# Patient Record
Sex: Male | Born: 1980
Health system: Southern US, Community
[De-identification: ages and names within clinical notes are randomized; demographics above are authoritative.]

## PROBLEM LIST (undated history)

## (undated) DIAGNOSIS — I10 Essential (primary) hypertension: Secondary | ICD-10-CM

## (undated) DIAGNOSIS — R7302 Impaired glucose tolerance (oral): Principal | ICD-10-CM

## (undated) DIAGNOSIS — M109 Gout, unspecified: Secondary | ICD-10-CM

## (undated) DIAGNOSIS — E781 Pure hyperglyceridemia: Secondary | ICD-10-CM

## (undated) HISTORY — DX: Pure hyperglyceridemia: E78.1

## (undated) HISTORY — DX: Impaired glucose tolerance (oral): R73.02

## (undated) HISTORY — DX: Gout, unspecified: M10.9

## (undated) HISTORY — DX: Essential (primary) hypertension: I10

---

## 2003-05-11 ENCOUNTER — Emergency Department (HOSPITAL_COMMUNITY): Admission: AD | Admit: 2003-05-11 | Discharge: 2003-05-12 | Payer: Self-pay | Admitting: Family Medicine

## 2004-07-25 ENCOUNTER — Emergency Department (HOSPITAL_COMMUNITY): Admission: EM | Admit: 2004-07-25 | Discharge: 2004-07-25 | Payer: Self-pay | Admitting: Family Medicine

## 2007-09-06 ENCOUNTER — Emergency Department (HOSPITAL_COMMUNITY): Admission: EM | Admit: 2007-09-06 | Discharge: 2007-09-06 | Payer: Self-pay | Admitting: Family Medicine

## 2009-12-13 ENCOUNTER — Emergency Department (HOSPITAL_BASED_OUTPATIENT_CLINIC_OR_DEPARTMENT_OTHER): Admission: EM | Admit: 2009-12-13 | Discharge: 2009-12-13 | Payer: Self-pay | Admitting: Emergency Medicine

## 2009-12-15 ENCOUNTER — Emergency Department (HOSPITAL_BASED_OUTPATIENT_CLINIC_OR_DEPARTMENT_OTHER): Admission: EM | Admit: 2009-12-15 | Discharge: 2009-12-15 | Payer: Self-pay | Admitting: Emergency Medicine

## 2010-07-17 LAB — CBC
HCT: 45.1 % (ref 39.0–52.0)
HCT: 40.9 % (ref 39.0–52.0)
MCH: 26.2 pg (ref 26.0–34.0)
MCHC: 34 g/dL (ref 30.0–36.0)
MCHC: 33.3 g/dL (ref 30.0–36.0)
MCV: 77.2 fL — ABNORMAL LOW (ref 78.0–100.0)
MCV: 77.4 fL — ABNORMAL LOW (ref 78.0–100.0)
Platelets: 203 10*3/uL (ref 150–400)
Platelets: 228 10*3/uL (ref 150–400)
RDW: 13.2 % (ref 11.5–15.5)
RDW: 13 % (ref 11.5–15.5)
WBC: 7.4 10*3/uL (ref 4.0–10.5)

## 2010-07-17 LAB — URINALYSIS, ROUTINE W REFLEX MICROSCOPIC
Bilirubin Urine: NEGATIVE
Bilirubin Urine: NEGATIVE
Glucose, UA: NEGATIVE mg/dL
Ketones, ur: NEGATIVE mg/dL
Ketones, ur: 15 mg/dL — AB
Leukocytes, UA: NEGATIVE
Nitrite: NEGATIVE
Nitrite: NEGATIVE
Protein, ur: NEGATIVE mg/dL
Protein, ur: 30 mg/dL — AB
Urobilinogen, UA: 0.2 mg/dL (ref 0.0–1.0)
pH: 6.5 (ref 5.0–8.0)

## 2010-07-17 LAB — DIFFERENTIAL
Basophils Absolute: 0 10*3/uL (ref 0.0–0.1)
Basophils Absolute: 0.1 10*3/uL (ref 0.0–0.1)
Basophils Relative: 2 % — ABNORMAL HIGH (ref 0–1)
Eosinophils Absolute: 0 10*3/uL (ref 0.0–0.7)
Eosinophils Absolute: 0 10*3/uL (ref 0.0–0.7)
Eosinophils Relative: 0 % (ref 0–5)
Eosinophils Relative: 0 % (ref 0–5)
Lymphocytes Relative: 16 % (ref 12–46)
Monocytes Absolute: 0.7 10*3/uL (ref 0.1–1.0)
Monocytes Absolute: 0.7 10*3/uL (ref 0.1–1.0)

## 2010-07-17 LAB — BASIC METABOLIC PANEL
BUN: 9 mg/dL (ref 6–23)
BUN: 10 mg/dL (ref 6–23)
CO2: 29 mEq/L (ref 19–32)
Chloride: 100 mEq/L (ref 96–112)
Chloride: 104 mEq/L (ref 96–112)
Creatinine, Ser: 1.1 mg/dL (ref 0.4–1.5)
Creatinine, Ser: 1.1 mg/dL (ref 0.4–1.5)
Glucose, Bld: 139 mg/dL — ABNORMAL HIGH (ref 70–99)
Glucose, Bld: 100 mg/dL — ABNORMAL HIGH (ref 70–99)
Potassium: 3.5 mEq/L (ref 3.5–5.1)

## 2010-07-17 LAB — URINE MICROSCOPIC-ADD ON

## 2010-09-21 ENCOUNTER — Encounter: Payer: Self-pay | Admitting: Internal Medicine

## 2010-09-22 ENCOUNTER — Encounter: Payer: Self-pay | Admitting: Internal Medicine

## 2010-09-22 ENCOUNTER — Other Ambulatory Visit (INDEPENDENT_AMBULATORY_CARE_PROVIDER_SITE_OTHER): Payer: 59

## 2010-09-22 ENCOUNTER — Ambulatory Visit (INDEPENDENT_AMBULATORY_CARE_PROVIDER_SITE_OTHER): Payer: 59 | Admitting: Internal Medicine

## 2010-09-22 VITALS — BP 142/104 | HR 95 | Temp 98.1°F | Ht 64.0 in | Wt 223.2 lb

## 2010-09-22 DIAGNOSIS — Z0001 Encounter for general adult medical examination with abnormal findings: Secondary | ICD-10-CM | POA: Insufficient documentation

## 2010-09-22 DIAGNOSIS — Z0000 Encounter for general adult medical examination without abnormal findings: Secondary | ICD-10-CM

## 2010-09-22 DIAGNOSIS — Z23 Encounter for immunization: Secondary | ICD-10-CM

## 2010-09-22 DIAGNOSIS — I10 Essential (primary) hypertension: Secondary | ICD-10-CM

## 2010-09-22 HISTORY — DX: Essential (primary) hypertension: I10

## 2010-09-22 LAB — URINALYSIS, ROUTINE W REFLEX MICROSCOPIC
Ketones, ur: NEGATIVE
Specific Gravity, Urine: 1.02 (ref 1.000–1.030)
Total Protein, Urine: NEGATIVE
Urine Glucose: NEGATIVE

## 2010-09-22 LAB — CBC WITH DIFFERENTIAL/PLATELET
Basophils Relative: 0.4 % (ref 0.0–3.0)
HCT: 45 % (ref 39.0–52.0)
Hemoglobin: 15.2 g/dL (ref 13.0–17.0)
Lymphocytes Relative: 19.7 % (ref 12.0–46.0)
Lymphs Abs: 1.6 10*3/uL (ref 0.7–4.0)
MCHC: 33.8 g/dL (ref 30.0–36.0)
Monocytes Relative: 8.2 % (ref 3.0–12.0)
Neutro Abs: 5.6 10*3/uL (ref 1.4–7.7)
RBC: 5.83 Mil/uL — ABNORMAL HIGH (ref 4.22–5.81)

## 2010-09-22 LAB — HEPATIC FUNCTION PANEL
Albumin: 3.8 g/dL (ref 3.5–5.2)
Alkaline Phosphatase: 91 U/L (ref 39–117)
Bilirubin, Direct: 0 mg/dL (ref 0.0–0.3)
Total Protein: 7.7 g/dL (ref 6.0–8.3)

## 2010-09-22 LAB — BASIC METABOLIC PANEL
CO2: 26 mEq/L (ref 19–32)
Calcium: 9.3 mg/dL (ref 8.4–10.5)
Creatinine, Ser: 0.9 mg/dL (ref 0.4–1.5)
GFR: 123.85 mL/min (ref >60.00)
Sodium: 141 mEq/L (ref 135–145)

## 2010-09-22 LAB — LIPID PANEL
HDL: 47.2 mg/dL (ref >39.00)
LDL Cholesterol: 73 mg/dL (ref 0–99)
Total CHOL/HDL Ratio: 3
Triglycerides: 197 mg/dL — ABNORMAL HIGH (ref 0.0–149.0)
VLDL: 39.4 mg/dL (ref 0.0–40.0)

## 2010-09-22 MED ORDER — TETANUS-DIPHTH-ACELL PERTUSSIS 5-2.5-18.5 LF-MCG/0.5 IM SUSP
0.5000 mL | Freq: Once | INTRAMUSCULAR | Status: AC
  Administered 2010-09-22: 0.5 mL via INTRAMUSCULAR

## 2010-09-22 MED ORDER — AMLODIPINE BESYLATE 5 MG PO TABS: 5.0000 mg | ORAL_TABLET | Freq: Every day | ORAL | Status: DC

## 2010-09-22 NOTE — Progress Notes (Signed)
  Subjective:    Patient ID: Peter Rivera, male    DOB: Sep 07, 1980, 30 y.o.   MRN: [id number]  HPI Here for wellness and f/u;  Overall doing ok;  Pt denies CP, worsening SOB, DOE, wheezing, orthopnea, PND, worsening LE edema, palpitations, dizziness or syncope.  Pt denies neurological change such as new Headache, facial or extremity weakness.  Pt denies polydipsia, polyuria, or low sugar symptoms. Pt states overall good compliance with treatment and medications, good tolerability, and trying to follow lower cholesterol diet.  Pt denies worsening depressive symptoms, suicidal ideation or panic. No fever, wt loss, night sweats, loss of appetite, or other constitutional symptoms.  Pt states good ability with ADL's, low fall risk, home safety reviewed and adequate, no significant changes in hearing or vision, and occasionally active with exercise.  No other new complaints or concerns today Past Medical History  Diagnosis Date  . Hypertension   . HTN (hypertension) 09/22/2010   History reviewed. No pertinent past surgical history.  reports that he has never smoked. He does not have any smokeless tobacco history on file. He reports that he drinks alcohol. He reports that he does not use illicit drugs. family history is not on file. No Known Allergies No current outpatient prescriptions on file prior to visit.   No current facility-administered medications on file prior to visit.    Review of Systems Review of Systems  Constitutional: Negative for diaphoresis, activity change, appetite change and unexpected weight change.  HENT: Negative for hearing loss, ear pain, facial swelling, mouth sores and neck stiffness.   Eyes: Negative for pain, redness and visual disturbance.  Respiratory: Negative for shortness of breath and wheezing.   Cardiovascular: Negative for chest pain and palpitations.  Gastrointestinal: Negative for diarrhea, blood in stool, abdominal distention and rectal pain.    Genitourinary: Negative for hematuria, flank pain and decreased urine volume.  Musculoskeletal: Negative for myalgias and joint swelling.  Skin: Negative for color change and wound.  Neurological: Negative for syncope and numbness.  Hematological: Negative for adenopathy.  Psychiatric/Behavioral: Negative for hallucinations, self-injury, decreased concentration and agitation.      Objective:   Physical Exam BP 142/104  Pulse 95  Temp(Src) 98.1 F (36.7 C) (Oral)  Ht 5\' 4"  (1.626 m)  Wt 223 lb 4 oz (101.266 kg)  BMI 38.32 kg/m2  SpO2 97% Physical Exam  VS noted Constitutional: Pt is oriented to person, place, and time. Appears well-developed and well-nourished.  HENT:  Head: Normocephalic and atraumatic.  Right Ear: External ear normal.  Left Ear: External ear normal.  Nose: Nose normal.  Mouth/Throat: Oropharynx is clear and moist.  Eyes: Conjunctivae and EOM are normal. Pupils are equal, round, and reactive to light.  Neck: Normal range of motion. Neck supple. No JVD present. No tracheal deviation present.  Cardiovascular: Normal rate, regular rhythm, normal heart sounds and intact distal pulses.   Pulmonary/Chest: Effort normal and breath sounds normal.  Abdominal: Soft. Bowel sounds are normal. There is no tenderness.  Musculoskeletal: Normal range of motion. Exhibits no edema.  Lymphadenopathy:  Has no cervical adenopathy.  Neurological: Pt is alert and oriented to person, place, and time. Pt has normal reflexes. No cranial nerve deficit.  Skin: Skin is warm and dry. No rash noted.  Psychiatric:  Has  normal mood and affect. Behavior is normal.         Assessment & Plan:

## 2010-09-22 NOTE — Patient Instructions (Signed)
You had the tetanus shot today Start the amlodipine 5 mg per day for blood pressure Please check your BP regularly, at the local drug stores or at home Please go to LAB in the Basement for the blood and/or urine tests to be done today Please call the phone number [phone] (the PhoneTree System) for results of testing in 2-3 days;  When calling, simply dial the number, and when prompted enter the MRN number above (the Medical Record Number) and the # key, then the message should start. Please return in 6 months

## 2010-09-22 NOTE — Assessment & Plan Note (Signed)
Overall doing well, age appropriate education and counseling updated, referrals for preventative services and immunizations addressed, dietary and smoking counseling addressed, most recent labs and ECG reviewed.  I have personally reviewed and have noted: 1) the patient's medical and social history 2) The pt's use of alcohol, tobacco, and illicit drugs 3) The patient's current medications and supplements 4) Functional ability including ADL's, fall risk, home safety risk, hearing and visual impairment 5) Diet and physical activities 6) Evidence for depression or mood disorder 7) The patient's height, weight, and BMI have been recorded in the chart I have made referrals, and provided counseling and education based on review of the above Also for tetanus today as he is due

## 2010-09-22 NOTE — Assessment & Plan Note (Signed)
Uncontrolled, more so since last sept when passed the [employer] physical,  To start amlodipine 5 mg, f/u  BP at home and next visit

## 2010-09-23 NOTE — Progress Notes (Signed)
Quick Note:  Voice message left on PhoneTree system - lab is negative, normal or otherwise stable, pt to continue same tx ______ 

## 2010-12-29 ENCOUNTER — Ambulatory Visit (INDEPENDENT_AMBULATORY_CARE_PROVIDER_SITE_OTHER): Payer: 59 | Admitting: Internal Medicine

## 2010-12-29 ENCOUNTER — Encounter: Payer: Self-pay | Admitting: Internal Medicine

## 2010-12-29 VITALS — BP 140/88 | HR 114 | Temp 98.7°F | Ht 64.0 in | Wt 223.4 lb

## 2010-12-29 DIAGNOSIS — I10 Essential (primary) hypertension: Secondary | ICD-10-CM

## 2010-12-29 DIAGNOSIS — R5383 Other fatigue: Secondary | ICD-10-CM | POA: Insufficient documentation

## 2010-12-29 DIAGNOSIS — E781 Pure hyperglyceridemia: Secondary | ICD-10-CM

## 2010-12-29 HISTORY — DX: Pure hyperglyceridemia: E78.1

## 2010-12-29 MED ORDER — LOSARTAN POTASSIUM 100 MG PO TABS: 100.0000 mg | ORAL_TABLET | Freq: Every day | ORAL | Status: DC

## 2010-12-29 NOTE — Patient Instructions (Addendum)
Take all new medications as prescribed - the generic cozaar 100 mg per day Continue all other medications as before - the generic for norvasc Please check your BP on a regular basis - your goal is to be less than 140/90 (though the top number at 110 would be considered very good, and 120's and 130's is OK) Please return in 3 months, or sooner if needed

## 2010-12-29 NOTE — Assessment & Plan Note (Addendum)
Improved, but still elevated, with goal < 140/90; to add cozaar 100 qd, f/u BP at home and next visit, work on wt loss, exercise, low salt diet  BP Readings from Last 3 Encounters:  12/29/10 140/88  09/22/10 142/104

## 2010-12-29 NOTE — Assessment & Plan Note (Signed)
Mild, d/w pt, for lower fat diet and wt loss as well

## 2010-12-29 NOTE — Assessment & Plan Note (Signed)
Etiology unclear, Exam otherwise benign,  follow with expectant management, last labs reveiwed with pt  Lab Results  Component Value Date   WBC 7.9 09/22/2010   HGB 15.2 09/22/2010   HCT 45.0 09/22/2010   PLT 311.0 09/22/2010   CHOL 160 09/22/2010   TRIG 197.0* 09/22/2010   HDL 47.20 09/22/2010   ALT 21 09/22/2010   AST 23 09/22/2010   NA 141 09/22/2010   K 4.4 09/22/2010   CL 106 09/22/2010   CREATININE 0.9 09/22/2010   BUN 14 09/22/2010   CO2 26 09/22/2010   TSH 0.84 09/22/2010

## 2010-12-29 NOTE — Progress Notes (Signed)
  Subjective:    Patient ID: Peter Rivera, male    DOB: 06/19/1980, 30 y.o.   MRN: [id number]  HPI  Here to f/u; overall doing ok,  Pt denies chest pain, increased sob or doe, wheezing, orthopnea, PND, increased LE swelling, palpitations, dizziness or syncope.  Pt denies new neurological symptoms such as new headache, or facial or extremity weakness or numbness   Pt denies polydipsia, polyuria,  Pt states overall good compliance with meds, trying to follow lower cholesterol  diet, wt overall stable but little exercise however. Has not had chance to check BP at home or elsewhere since last visit.  Good med compliance and tolerability.  No other new complaints.  Does have sense of ongoing fatigue, but denies signficant hypersomnolence., and seems to perform well at work, which is quite physically demanding Past Medical History  Diagnosis Date  . Hypertension   . HTN (hypertension) 09/22/2010   No past surgical history on file.  reports that he has never smoked. He does not have any smokeless tobacco history on file. He reports that he drinks alcohol. He reports that he does not use illicit drugs. family history is not on file. No Known Allergies Current Outpatient Prescriptions on File Prior to Visit  Medication Sig Dispense Refill  . amLODipine (NORVASC) 5 MG tablet Take 1 tablet (5 mg total) by mouth daily.  90 tablet  3   Review of Systems Review of Systems  Constitutional: Negative for diaphoresis and unexpected weight change.  HENT: Negative for drooling and tinnitus.   Eyes: Negative for photophobia and visual disturbance.  Respiratory: Negative for choking and stridor.   Gastrointestinal: Negative for vomiting and blood in stool.  Genitourinary: Negative for hematuria and decreased urine volume.      Objective:   Physical Exam BP 140/88  Pulse 114  Temp(Src) 98.7 F (37.1 C) (Oral)  Ht 5\' 4"  (1.626 m)  Wt 223 lb 6 oz (101.322 kg)  BMI 38.34 kg/m2  SpO2 97% Physical Exam  VS  noted Constitutional: Pt appears well-developed and well-nourished.  HENT: Head: Normocephalic.  Right Ear: External ear normal.  Left Ear: External ear normal.  Eyes: Conjunctivae and EOM are normal. Pupils are equal, round, and reactive to light.  Neck: Normal range of motion. Neck supple.  Cardiovascular: Normal rate and regular rhythm.   Pulmonary/Chest: Effort normal and breath sounds normal.  Neurological: Pt is alert. No cranial nerve deficit.  Skin: Skin is warm. No erythema.  Psychiatric: Pt behavior is normal. Thought content normal.         Assessment & Plan:

## 2011-03-29 ENCOUNTER — Other Ambulatory Visit (INDEPENDENT_AMBULATORY_CARE_PROVIDER_SITE_OTHER): Payer: BC Managed Care – PPO

## 2011-03-29 ENCOUNTER — Encounter: Payer: Self-pay | Admitting: Internal Medicine

## 2011-03-29 ENCOUNTER — Ambulatory Visit (INDEPENDENT_AMBULATORY_CARE_PROVIDER_SITE_OTHER): Payer: BC Managed Care – PPO | Admitting: Internal Medicine

## 2011-03-29 VITALS — BP 132/90 | HR 98 | Temp 98.0°F | Ht 64.0 in | Wt 227.5 lb

## 2011-03-29 DIAGNOSIS — R7302 Impaired glucose tolerance (oral): Secondary | ICD-10-CM

## 2011-03-29 DIAGNOSIS — Z0000 Encounter for general adult medical examination without abnormal findings: Secondary | ICD-10-CM

## 2011-03-29 DIAGNOSIS — R7309 Other abnormal glucose: Secondary | ICD-10-CM

## 2011-03-29 DIAGNOSIS — E781 Pure hyperglyceridemia: Secondary | ICD-10-CM

## 2011-03-29 DIAGNOSIS — I10 Essential (primary) hypertension: Secondary | ICD-10-CM

## 2011-03-29 HISTORY — DX: Impaired glucose tolerance (oral): R73.02

## 2011-03-29 LAB — LIPID PANEL
Cholesterol: 152 mg/dL (ref 0–200)
LDL Cholesterol: 87 mg/dL (ref 0–99)
Total CHOL/HDL Ratio: 4
Triglycerides: 114 mg/dL (ref 0.0–149.0)

## 2011-03-29 LAB — BASIC METABOLIC PANEL
Calcium: 9.1 mg/dL (ref 8.4–10.5)
GFR: 95.41 mL/min (ref >60.00)
Potassium: 3.9 mEq/L (ref 3.5–5.1)
Sodium: 140 mEq/L (ref 135–145)

## 2011-03-29 LAB — HEMOGLOBIN A1C: Hgb A1c MFr Bld: 5.7 % (ref 4.6–6.5)

## 2011-03-29 NOTE — Assessment & Plan Note (Signed)
stable overall by hx and exam, most recent data reviewed with pt, and pt to continue medical treatment as before  BP Readings from Last 3 Encounters:  03/29/11 132/90  12/29/10 140/88  09/22/10 142/104

## 2011-03-29 NOTE — Progress Notes (Signed)
  Subjective:    Patient ID: Peter Rivera, male    DOB: 1981/04/28, 30 y.o.   MRN: [id number]  HPI Here to f/u; overall doing ok,  Pt denies chest pain, increased sob or doe, wheezing, orthopnea, PND, increased LE swelling, palpitations, dizziness or syncope.  Pt denies new neurological symptoms such as new headache, or facial or extremity weakness or numbness   Pt denies polydipsia, polyuria, or low sugar symptoms such as weakness or confusion improved with po intake.  Pt states overall good compliance with meds, trying to follow lower cholesterol, diabetic diet, wt overall stable but little exercise however. No other new complaints at this time Past Medical History  Diagnosis Date  . Hypertension   . HTN (hypertension) 09/22/2010  . Hypertriglyceridemia 12/29/2010  . Impaired glucose tolerance 03/29/2011   No past surgical history on file.  reports that he has never smoked. He does not have any smokeless tobacco history on file. He reports that he drinks alcohol. He reports that he does not use illicit drugs. family history is not on file. No Known Allergies Current Outpatient Prescriptions on File Prior to Visit  Medication Sig Dispense Refill  . amLODipine (NORVASC) 5 MG tablet Take 1 tablet (5 mg total) by mouth daily.  90 tablet  3  . losartan (COZAAR) 100 MG tablet Take 1 tablet (100 mg total) by mouth daily.  90 tablet  3   Review of Systems Review of Systems  Constitutional: Negative for diaphoresis and unexpected weight change.  HENT: Negative for drooling and tinnitus.   Eyes: Negative for photophobia and visual disturbance.  Respiratory: Negative for choking and stridor.   Gastrointestinal: Negative for vomiting and blood in stool.  Genitourinary: Negative for hematuria and decreased urine volume.    Objective:   Physical Exam BP 132/90  Pulse 98  Temp(Src) 98 F (36.7 C) (Oral)  Ht 5\' 4"  (1.626 m)  Wt 227 lb 8 oz (103.193 kg)  BMI 39.05 kg/m2  SpO2 96% Physical  Exam  VS noted Constitutional: Pt appears well-developed and well-nourished.  HENT: Head: Normocephalic.  Right Ear: External ear normal.  Left Ear: External ear normal.  Eyes: Conjunctivae and EOM are normal. Pupils are equal, round, and reactive to light.  Neck: Normal range of motion. Neck supple.  Cardiovascular: Normal rate and regular rhythm.   Pulmonary/Chest: Effort normal and breath sounds normal.  Abd:  Soft, NT, non-distended, + BS Neurological: Pt is alert. No cranial nerve deficit.  Skin: Skin is warm. No erythema.  Psychiatric: Pt behavior is normal. Thought content normal.     Assessment & Plan:

## 2011-03-29 NOTE — Assessment & Plan Note (Signed)
stable overall by hx and exam, most recent data reviewed with pt, and pt to continue medical treatment as before, for wt loss/diet and check a1c

## 2011-03-29 NOTE — Patient Instructions (Signed)
Continue all other medications as before Please remember to focus on low fat/low cholesterol diet, regular exercise, and weight control Please go to LAB in the Basement for the blood and/or urine tests to be done today Please call the phone number [phone] (the PhoneTree System) for results of testing in 2-3 days;  When calling, simply dial the number, and when prompted enter the MRN number above (the Medical Record Number) and the # key, then the message should start. Please return in 6 mo with Lab testing done 3-5 days before

## 2011-03-29 NOTE — Assessment & Plan Note (Signed)
stable overall by hx and exam, most recent data reviewed with pt, and pt to continue medical treatment as before Lab Results  Component Value Date   CHOL 160 09/22/2010   HDL 47.20 09/22/2010   LDLCALC 73 09/22/2010   TRIG 197.0* 09/22/2010   CHOLHDL 3 09/22/2010

## 2011-08-08 ENCOUNTER — Encounter (HOSPITAL_COMMUNITY): Payer: Self-pay | Admitting: *Deleted

## 2011-08-08 ENCOUNTER — Emergency Department (HOSPITAL_COMMUNITY)
Admission: EM | Admit: 2011-08-08 | Discharge: 2011-08-08 | Disposition: A | Discharge status: Home / Self Care | Payer: BC Managed Care – PPO | Source: Home / Self Care | Attending: Family Medicine | Admitting: Family Medicine

## 2011-08-08 ENCOUNTER — Emergency Department (INDEPENDENT_AMBULATORY_CARE_PROVIDER_SITE_OTHER): Payer: BC Managed Care – PPO

## 2011-08-08 DIAGNOSIS — M109 Gout, unspecified: Secondary | ICD-10-CM

## 2011-08-08 LAB — URIC ACID: Uric Acid, Serum: 8.7 mg/dL — ABNORMAL HIGH (ref 4.0–7.8)

## 2011-08-08 MED ORDER — METHYLPREDNISOLONE SODIUM SUCC 125 MG IJ SOLR
125.0000 mg | Freq: Once | INTRAMUSCULAR | Status: AC
  Administered 2011-08-08: 125 mg via INTRAMUSCULAR

## 2011-08-08 MED ORDER — INDOMETHACIN 50 MG PO CAPS: 50.0000 mg | ORAL_CAPSULE | Freq: Three times a day (TID) | ORAL | Status: DC

## 2011-08-08 MED ORDER — COLCHICINE 0.6 MG PO TABS: 0.6000 mg | ORAL_TABLET | Freq: Every day | ORAL | Status: DC

## 2011-08-08 MED ORDER — KETOROLAC TROMETHAMINE 60 MG/2ML IM SOLN
INTRAMUSCULAR | Status: AC
  Filled 2011-08-08: qty 2

## 2011-08-08 MED ORDER — KETOROLAC TROMETHAMINE 60 MG/2ML IM SOLN
60.0000 mg | Freq: Once | INTRAMUSCULAR | Status: AC
  Administered 2011-08-08: 60 mg via INTRAMUSCULAR

## 2011-08-08 MED ORDER — METHYLPREDNISOLONE SODIUM SUCC 125 MG IJ SOLR
INTRAMUSCULAR | Status: AC
  Filled 2011-08-08: qty 2

## 2011-08-08 MED ORDER — HYDROCODONE-ACETAMINOPHEN 5-325 MG PO TABS: 1.0000 | ORAL_TABLET | Freq: Three times a day (TID) | ORAL | Status: AC | PRN

## 2011-08-08 NOTE — Discharge Instructions (Signed)
Gout Gout is an inflammatory condition (arthritis) caused by a buildup of uric acid crystals in the joints. Uric acid is a chemical that is normally present in the blood. Under some circumstances, uric acid can form into crystals in your joints. This causes joint redness, soreness, and swelling (inflammation). Repeat attacks are common. Over time, uric acid crystals can form into masses (tophi) near a joint, causing disfigurement. Gout is treatable and often preventable. CAUSES  The disease begins with elevated levels of uric acid in the blood. Uric acid is produced by your body when it breaks down a naturally found substance called purines. This also happens when you eat certain foods such as meats and fish. Causes of an elevated uric acid level include:  Being passed down from parent to child (heredity).   Diseases that cause increased uric acid production (obesity, psoriasis, some cancers).   Excessive alcohol use.   Diet, especially diets rich in meat and seafood.   Medicines, including certain cancer-fighting drugs (chemotherapy), diuretics, and aspirin.   Chronic kidney disease. The kidneys are no longer able to remove uric acid well.   Problems with metabolism.  Conditions strongly associated with gout include:  Obesity.   High blood pressure.   High cholesterol.   Diabetes.  Not everyone with elevated uric acid levels gets gout. It is not understood why some people get gout and others do not. Surgery, joint injury, and eating too much of certain foods are some of the factors that can lead to gout. SYMPTOMS   An attack of gout comes on quickly. It causes intense pain with redness, swelling, and warmth in a joint.   Fever can occur.   Often, only one joint is involved. Certain joints are more commonly involved:   Base of the big toe.   Knee.   Ankle.   Wrist.   Finger.  Without treatment, an attack usually goes away in a few days to weeks. Between attacks, you  usually will not have symptoms, which is different from many other forms of arthritis. DIAGNOSIS  Your caregiver will suspect gout based on your symptoms and exam. Removal of fluid from the joint (arthrocentesis) is done to check for uric acid crystals. Your caregiver will give you a medicine that numbs the area (local anesthetic) and use a needle to remove joint fluid for exam. Gout is confirmed when uric acid crystals are seen in joint fluid, using a special microscope. Sometimes, blood, urine, and X-ray tests are also used. TREATMENT  There are 2 phases to gout treatment: treating the sudden onset (acute) attack and preventing attacks (prophylaxis). Treatment of an Acute Attack  Medicines are used. These include anti-inflammatory medicines or steroid medicines.   An injection of steroid medicine into the affected joint is sometimes necessary.   The painful joint is rested. Movement can worsen the arthritis.   You may use warm or cold treatments on painful joints, depending which works best for you.   Discuss the use of coffee, vitamin C, or cherries with your caregiver. These may be helpful treatment options.  Treatment to Prevent Attacks After the acute attack subsides, your caregiver may advise prophylactic medicine. These medicines either help your kidneys eliminate uric acid from your body or decrease your uric acid production. You may need to stay on these medicines for a very long time. The early phase of treatment with prophylactic medicine can be associated with an increase in acute gout attacks. For this reason, during the first few months   of treatment, your caregiver may also advise you to take medicines usually used for acute gout treatment. Be sure you understand your caregiver's directions. You should also discuss dietary treatment with your caregiver. Certain foods such as meats and fish can increase uric acid levels. Other foods such as dairy can decrease levels. Your caregiver  can give you a list of foods to avoid. HOME CARE INSTRUCTIONS   Do not take aspirin to relieve pain. This raises uric acid levels.   Only take over-the-counter or prescription medicines for pain, discomfort, or fever as directed by your caregiver.   Rest the joint as much as possible. When in bed, keep sheets and blankets off painful areas.   Keep the affected joint raised (elevated).   Use crutches if the painful joint is in your leg.   Drink enough water and fluids to keep your urine clear or pale yellow. This helps your body get rid of uric acid. Do not drink alcoholic beverages. They slow the passage of uric acid.   Follow your caregiver's dietary instructions. Pay careful attention to the amount of protein you eat. Your daily diet should emphasize fruits, vegetables, whole grains, and fat-free or low-fat milk products.   Maintain a healthy body weight.  SEEK MEDICAL CARE IF:   You have an oral temperature above 102 F (38.9 C).   You develop diarrhea, vomiting, or any side effects from medicines.   You do not feel better in 24 hours, or you are getting worse.  SEEK IMMEDIATE MEDICAL CARE IF:   Your joint becomes suddenly more tender and you have:   Chills.   An oral temperature above 102 F (38.9 C), not controlled by medicine.  MAKE SURE YOU:   Understand these instructions.   Will watch your condition.   Will get help right away if you are not doing well or get worse.  Document Released: 04/16/2000 Document Revised: 04/08/2011 Document Reviewed: 07/28/2009 [employer] Patient Information 2012 [employer].Purine Restricted Diet A low-purine diet consists of foods that reduce uric acid made in your body. INDICATIONS FOR USE  Your caregiver may ask you to follow a low-purine diet to reduce gout flairs.  GUIDELINES  Avoid high-purine foods, including all alcohol, yeast extracts taken as supplements, and sauces made from meats (like gravy). Do not eat high-purine  meats, including anchovies, sardines, herring, mussels, tuna, codfish, scallops, trout, haddock, bacon, organ meats, tripe, goose, wild game, and sweetbreads.  Grains  Allowed/Recommended: All, except those listed to consume in moderation.   Consume in Moderation: Oatmeal (? cup uncooked daily), wheat bran or germ ( cup daily), and whole grains.  Vegetables  Allowed/Recommended: All, except those listed to consume in moderation.   Consume in Moderation: Asparagus, cauliflower, spinach, mushrooms, and green peas ( cup daily).  Fruit  Allowed/Recommended: All.   Consume in Moderation: None.  Meat and Meat Substitutes  Allowed/Recommended: Eggs, nuts, and peanut butter.   Consume in Moderation: Limit to 4 to 6 oz daily. Avoid high-purine meats. Lentils, peas, and dried beans (1 cup daily).  Milk  Allowed/Recommended: All. Choose low-fat or skim when possible.   Consume in Moderation: None.  Fats and Oils  Allowed/Recommended: All.   Consume in Moderation: None.  Beverages  Allowed/Recommended: All, except those listed to avoid.   Avoid: All alcohol.  Condiments/Miscellaneous  Allowed/Recommended: All, except those listed to consume in moderation.   Consume in Moderation: Bouillon and meat-based broths and soups.  Document Released: 08/14/2010 Document Revised: 04/08/2011 Document   Reviewed: 08/14/2010 [employer] Patient Information 2012 [employer]. 

## 2011-08-08 NOTE — ED Notes (Signed)
Co swelling and pain to left ankle area x 1 day, denies injury, states pain and swelling has occurred before but did not seek medical tx

## 2011-08-08 NOTE — ED Provider Notes (Signed)
History     CSN: [id number]  Arrival date & time 08/08/11  0930   First MD Initiated Contact with Patient 08/08/11 1011      Chief Complaint  Patient presents with  . Joint Swelling    (Consider location/radiation/quality/duration/timing/severity/associated sxs/prior treatment) Patient is a 31 y.o. male presenting with lower extremity pain. The history is provided by a significant other and the patient. No language interpreter was used.  Foot Pain This is a new problem. The current episode started yesterday. The problem occurs constantly. The problem has been rapidly worsening. Pertinent negatives include no chest pain, no abdominal pain, no headaches and no shortness of breath. Associated symptoms comments: No HX of injury. The symptoms are aggravated by exertion, standing and walking. The symptoms are relieved by nothing. He has tried rest and acetaminophen for the symptoms.    Past Medical History  Diagnosis Date  . Hypertension   . HTN (hypertension) 09/22/2010  . Hypertriglyceridemia 12/29/2010  . Impaired glucose tolerance 03/29/2011    History reviewed. No pertinent past surgical history.  History reviewed. No pertinent family history.  History  Substance Use Topics  . Smoking status: Never Smoker   . Smokeless tobacco: Not on file  . Alcohol Use: Yes     occasional      Review of Systems  Constitutional: Negative.   Respiratory: Negative for shortness of breath.   Cardiovascular: Negative for chest pain.  Gastrointestinal: Negative for abdominal pain.  Neurological: Negative for headaches.  All other systems reviewed and are negative.    Allergies  Review of patient's allergies indicates no known allergies.  Home Medications   Current Outpatient Rx  Name Route Sig Dispense Refill  . AMLODIPINE BESYLATE 5 MG PO TABS Oral Take 1 tablet (5 mg total) by mouth daily. 90 tablet 3  . LOSARTAN POTASSIUM 100 MG PO TABS Oral Take 1 tablet (100 mg total) by mouth  daily. 90 tablet 3    BP 142/89  Pulse 109  Temp(Src) 98.7 F (37.1 C) (Oral)  Resp 19  SpO2 98%  Physical Exam  Constitutional: He is oriented to person, place, and time. He appears well-developed and well-nourished.  Eyes: EOM are normal.  Musculoskeletal: He exhibits edema and tenderness.       Patient L footis warm and tender to palpation.  Neurological: He is alert and oriented to person, place, and time.  Skin: Skin is warm and dry. No erythema.  Psychiatric: He has a normal mood and affect. His behavior is normal.    ED Course  Procedures (including critical care time)   Labs Reviewed  URIC ACID    Gout, joint pain   MDM          Hassan Rowan, MD 08/08/11 2132

## 2011-08-10 ENCOUNTER — Telehealth (HOSPITAL_COMMUNITY): Payer: Self-pay | Admitting: *Deleted

## 2011-08-10 NOTE — ED Notes (Signed)
[name]  Pt. adequately treated with Colchicine and Indocin. I called pt. to see if he got his result while he was here. Pt. verified and he said the doctor told him it was 8 point something. I told him it was 8.7 and this indicates gout. I told him he was adequately treated. I asked if he had a doctor to follow up with, because he may need long term treatment to lower his level. This would help prevent gout. Pt.'s PCP is Dr. Shary Key and he thanked me for reminding him to schedule a f/u appointment. Peter Rivera 08/10/2011

## 2011-08-16 ENCOUNTER — Ambulatory Visit (INDEPENDENT_AMBULATORY_CARE_PROVIDER_SITE_OTHER): Payer: BC Managed Care – PPO | Admitting: Internal Medicine

## 2011-08-16 ENCOUNTER — Encounter: Payer: Self-pay | Admitting: Internal Medicine

## 2011-08-16 VITALS — BP 106/72 | HR 98 | Temp 98.2°F | Ht 64.0 in | Wt 212.5 lb

## 2011-08-16 DIAGNOSIS — R7302 Impaired glucose tolerance (oral): Secondary | ICD-10-CM

## 2011-08-16 DIAGNOSIS — M109 Gout, unspecified: Secondary | ICD-10-CM | POA: Insufficient documentation

## 2011-08-16 DIAGNOSIS — R7309 Other abnormal glucose: Secondary | ICD-10-CM

## 2011-08-16 DIAGNOSIS — R197 Diarrhea, unspecified: Secondary | ICD-10-CM | POA: Insufficient documentation

## 2011-08-16 DIAGNOSIS — I10 Essential (primary) hypertension: Secondary | ICD-10-CM

## 2011-08-16 HISTORY — DX: Gout, unspecified: M10.9

## 2011-08-16 MED ORDER — ALLOPURINOL 100 MG PO TABS: 100.0000 mg | ORAL_TABLET | Freq: Every day | ORAL | Status: DC

## 2011-08-16 MED ORDER — INDOMETHACIN 50 MG PO CAPS: 50.0000 mg | ORAL_CAPSULE | Freq: Three times a day (TID) | ORAL | Status: DC

## 2011-08-16 NOTE — Assessment & Plan Note (Signed)
Asympt, pt to call for onset polys and with glc next visit, should be ok as not on prednisone for now Lab Results  Component Value Date   HGBA1C 5.7 03/29/2011

## 2011-08-16 NOTE — Assessment & Plan Note (Signed)
Second episode in 2 months, current left ankle episode resolved, to start lower dose allopurinol 100 qd, check uric acid with next labs may 2013, cont indocin prn future attack, hold colchicine in case the recent n/v,diarrhea was related

## 2011-08-16 NOTE — Assessment & Plan Note (Signed)
?   Viral vs med realted, now improved, exam benign, ok to follow

## 2011-08-16 NOTE — Assessment & Plan Note (Signed)
stable overall by hx and exam, most recent data reviewed with pt, and pt to continue medical treatment as before  BP Readings from Last 3 Encounters:  08/16/11 106/72  08/08/11 142/89  03/29/11 132/90

## 2011-08-16 NOTE — Patient Instructions (Signed)
Please stop the colchicine as it may have caused the diarrhea OK to continue the Indomethacin AS NEEDED only for future attack Please start the lower dose allopurinol 100 mg per day for prevention of gout, since this was likely your second attack in 2 months Continue all other medications as before Please have the pharmacy call with any refills you may need. We will add a Uric Acid blood test for your next blood work

## 2011-08-16 NOTE — Progress Notes (Signed)
  Subjective:    Patient ID: Peter Rivera, male    DOB: [birth year], 31 y.o.   MRN: [id number]  HPI Here to f/u recent tx for left ankle gout episode, with uric acid > 10, actually second episode (first resolved on its own 2 mo ago), but unfort course after seen at UC involved n/v and diarrhea, not clear if coincidental viral illness vs med effect, but was only using the colchicine and indicin once daily.  Stopped the indocin and colchicine 2 days ago and GI symtpoms resolved,, and fortunately left ankle pain/swelling now resolved as well.   Pt denies fever, wt loss, night sweats, loss of appetite, or other constitutional symptoms, and no recent trauma.  Pt denies chest pain, increased sob or doe, wheezing, orthopnea, PND, increased LE swelling, palpitations, dizziness or syncope.  Pt denies new neurological symptoms such as new headache, or facial or extremity weakness or numbness.   Pt denies polydipsia, polyuria, but admits to high protein diet recently with meats.   Past Medical History  Diagnosis Date  . Hypertension   . HTN (hypertension) 09/22/2010  . Hypertriglyceridemia 12/29/2010  . Impaired glucose tolerance 03/29/2011  . Acute gouty arthropathy 08/16/2011   No past surgical history on file.  reports that he has never smoked. He does not have any smokeless tobacco history on file. He reports that he drinks alcohol. He reports that he does not use illicit drugs. family history is not on file. No Known Allergies Current Outpatient Prescriptions on File Prior to Visit  Medication Sig Dispense Refill  . amLODipine (NORVASC) 5 MG tablet Take 1 tablet (5 mg total) by mouth daily.  90 tablet  3  . HYDROcodone-acetaminophen (NORCO) 5-325 MG per tablet Take 1 tablet by mouth every 8 (eight) hours as needed for pain.  20 tablet  0  . losartan (COZAAR) 100 MG tablet Take 1 tablet (100 mg total) by mouth daily.  90 tablet  3  . allopurinol (ZYLOPRIM) 100 MG tablet Take 1 tablet (100 mg total) by  mouth daily.  90 tablet  3   Review of Systems Constitutional: Negative for diaphoresis and unexpected weight change.  HENT: Negative for drooling and tinnitus.   Eyes: Negative for photophobia and visual disturbance.  Respiratory: Negative for choking and stridor.   Gastrointestinal: Negative for blood in stool.  Genitourinary: Negative for hematuria and decreased urine volume.  Musculoskeletal: Negative for gait problem.  Skin: Negative for color change and wound.  Neurological: Negative for tremors and numbness.     Objective:   Physical Exam BP 106/72  Pulse 98  Temp(Src) 98.2 F (36.8 C) (Oral)  Ht 5\' 4"  (1.626 m)  Wt 212 lb 8 oz (96.389 kg)  BMI 36.48 kg/m2  SpO2 97% Physical Exam  VS noted Constitutional: Pt appears well-developed and well-nourished.  HENT: Head: Normocephalic.  Right Ear: External ear normal.  Left Ear: External ear normal.  Eyes: Conjunctivae and EOM are normal. Pupils are equal, round, and reactive to light.  Neck: Normal range of motion. Neck supple.  Bilat tm's mild erythema.  Sinus nontender.  Pharynx mild erythema Cardiovascular: Normal rate and regular rhythm.   Pulmonary/Chest: Effort normal and breath sounds normal.  Neurological: Pt is alert. No cranial nerve deficit.  Left ankle without swelling, tender, redness Skin: Skin is warm. No erythema. No rash Psychiatric: Pt behavior is normal. Thought content normal.  Not overly nervous or depressed affect    Assessment & Plan:

## 2011-09-17 ENCOUNTER — Other Ambulatory Visit (INDEPENDENT_AMBULATORY_CARE_PROVIDER_SITE_OTHER): Payer: BC Managed Care – PPO

## 2011-09-17 ENCOUNTER — Ambulatory Visit (INDEPENDENT_AMBULATORY_CARE_PROVIDER_SITE_OTHER): Payer: BC Managed Care – PPO | Admitting: Internal Medicine

## 2011-09-17 ENCOUNTER — Encounter: Payer: Self-pay | Admitting: Internal Medicine

## 2011-09-17 VITALS — BP 122/82 | HR 98 | Temp 98.4°F | Ht 64.0 in | Wt 214.0 lb

## 2011-09-17 DIAGNOSIS — R7302 Impaired glucose tolerance (oral): Secondary | ICD-10-CM

## 2011-09-17 DIAGNOSIS — M109 Gout, unspecified: Secondary | ICD-10-CM

## 2011-09-17 DIAGNOSIS — Z0000 Encounter for general adult medical examination without abnormal findings: Secondary | ICD-10-CM

## 2011-09-17 DIAGNOSIS — R7309 Other abnormal glucose: Secondary | ICD-10-CM

## 2011-09-17 DIAGNOSIS — E781 Pure hyperglyceridemia: Secondary | ICD-10-CM

## 2011-09-17 DIAGNOSIS — I10 Essential (primary) hypertension: Secondary | ICD-10-CM

## 2011-09-17 LAB — CBC WITH DIFFERENTIAL/PLATELET
Basophils Absolute: 0 10*3/uL (ref 0.0–0.1)
Eosinophils Absolute: 0.2 10*3/uL (ref 0.0–0.7)
HCT: 43.1 % (ref 39.0–52.0)
Hemoglobin: 14.2 g/dL (ref 13.0–17.0)
Lymphs Abs: 1.8 10*3/uL (ref 0.7–4.0)
MCHC: 32.9 g/dL (ref 30.0–36.0)
MCV: 76.7 fl — ABNORMAL LOW (ref 78.0–100.0)
Monocytes Absolute: 0.8 10*3/uL (ref 0.1–1.0)
Monocytes Relative: 9.2 % (ref 3.0–12.0)
Neutro Abs: 5.6 10*3/uL (ref 1.4–7.7)
Platelets: 313 10*3/uL (ref 150.0–400.0)
RDW: 15.1 % — ABNORMAL HIGH (ref 11.5–14.6)

## 2011-09-17 LAB — TSH: TSH: 1.18 u[IU]/mL (ref 0.35–5.50)

## 2011-09-17 LAB — HEPATIC FUNCTION PANEL
AST: 19 U/L (ref 0–37)
Albumin: 3.8 g/dL (ref 3.5–5.2)
Alkaline Phosphatase: 91 U/L (ref 39–117)
Bilirubin, Direct: 0 mg/dL (ref 0.0–0.3)

## 2011-09-17 LAB — LIPID PANEL
HDL: 40.2 mg/dL (ref >39.00)
Total CHOL/HDL Ratio: 4

## 2011-09-17 LAB — BASIC METABOLIC PANEL
GFR: 101.19 mL/min (ref >60.00)
Glucose, Bld: 92 mg/dL (ref 70–99)
Potassium: 3.7 mEq/L (ref 3.5–5.1)
Sodium: 142 mEq/L (ref 135–145)

## 2011-09-17 LAB — URINALYSIS, ROUTINE W REFLEX MICROSCOPIC
Bilirubin Urine: NEGATIVE
Hgb urine dipstick: NEGATIVE
Ketones, ur: NEGATIVE
Leukocytes, UA: NEGATIVE
Urine Glucose: NEGATIVE
Urobilinogen, UA: 0.2 (ref 0.0–1.0)

## 2011-09-17 NOTE — Patient Instructions (Signed)
Continue all other medications as before Please go to LAB in the Basement for the blood and/or urine tests to be done today You will be contacted by phone if any changes need to be made immediately.  Otherwise, you will receive a letter about your results with an explanation. You are otherwise up to date with prevention Please return in 6 mo with Lab testing done 3-5 days before

## 2011-09-18 ENCOUNTER — Encounter: Payer: Self-pay | Admitting: Internal Medicine

## 2011-09-18 NOTE — Assessment & Plan Note (Signed)
stable overall by hx and exam, most recent data reviewed with pt, and pt to continue medical treatment as before Lab Results  Component Value Date   HGBA1C 5.9 09/17/2011    

## 2011-09-18 NOTE — Assessment & Plan Note (Signed)
stable overall by hx and exam, most recent data reviewed with pt, and pt to continue medical treatment as before BP Readings from Last 3 Encounters:  09/17/11 122/82  08/16/11 106/72  08/08/11 142/89

## 2011-09-18 NOTE — Assessment & Plan Note (Signed)

## 2011-09-18 NOTE — Progress Notes (Signed)
Subjective:    Patient ID: Peter Rivera, male    DOB: [birth year], 31 y.o.   MRN: [id number]  HPI  Here for wellness and f/u;  Overall doing ok;  Pt denies CP, worsening SOB, DOE, wheezing, orthopnea, PND, worsening LE edema, palpitations, dizziness or syncope.  Pt denies neurological change such as new Headache, facial or extremity weakness.  Pt denies polydipsia, polyuria, or low sugar symptoms. Pt states overall good compliance with treatment and medications, good tolerability, and trying to follow lower cholesterol diet.  Pt denies worsening depressive symptoms, suicidal ideation or panic. No fever, wt loss, night sweats, loss of appetite, or other constitutional symptoms.  Pt states good ability with ADL's, low fall risk, home safety reviewed and adequate, no significant changes in hearing or vision, and occasionally active with exercise.  No recent worsening gout symptoms except for a couple of "twinges" toleft foot better with the indocin prn,  Wt overall is down intentionally with better diet from 223 to 214. Past Medical History  Diagnosis Date  . Hypertension   . HTN (hypertension) 09/22/2010  . Hypertriglyceridemia 12/29/2010  . Impaired glucose tolerance 03/29/2011  . Acute gouty arthropathy 08/16/2011   No past surgical history on file.  reports that he has never smoked. He does not have any smokeless tobacco history on file. He reports that he drinks alcohol. He reports that he does not use illicit drugs. family history is not on file. No Known Allergies Current Outpatient Prescriptions on File Prior to Visit  Medication Sig Dispense Refill  . allopurinol (ZYLOPRIM) 100 MG tablet Take 1 tablet (100 mg total) by mouth daily.  90 tablet  3  . amLODipine (NORVASC) 5 MG tablet Take 1 tablet (5 mg total) by mouth daily.  90 tablet  3  . indomethacin (INDOCIN) 50 MG capsule Take 1 capsule (50 mg total) by mouth 3 (three) times daily with meals. Take 1-3 x a day for acute flare up  90  capsule  2  . losartan (COZAAR) 100 MG tablet Take 1 tablet (100 mg total) by mouth daily.  90 tablet  3   Review of Systems Review of Systems  Constitutional: Negative for diaphoresis, activity change, appetite change and unexpected weight change.  HENT: Negative for hearing loss, ear pain, facial swelling, mouth sores and neck stiffness.   Eyes: Negative for pain, redness and visual disturbance.  Respiratory: Negative for shortness of breath and wheezing.   Cardiovascular: Negative for chest pain and palpitations.  Gastrointestinal: Negative for diarrhea, blood in stool, abdominal distention and rectal pain.  Genitourinary: Negative for hematuria, flank pain and decreased urine volume.  Musculoskeletal: Negative for myalgias and joint swelling.  Skin: Negative for color change and wound.  Neurological: Negative for syncope and numbness.  Hematological: Negative for adenopathy.  Psychiatric/Behavioral: Negative for hallucinations, self-injury, decreased concentration and agitation.      Objective:   Physical Exam BP 122/82  Pulse 98  Temp(Src) 98.4 F (36.9 C) (Oral)  Ht 5\' 4"  (1.626 m)  Wt 214 lb (97.07 kg)  BMI 36.73 kg/m2  SpO2 96% Physical Exam  VS noted Constitutional: Pt is oriented to person, place, and time. Appears well-developed and well-nourished.  HENT:  Head: Normocephalic and atraumatic.  Right Ear: External ear normal.  Left Ear: External ear normal.  Nose: Nose normal.  Mouth/Throat: Oropharynx is clear and moist.  Eyes: Conjunctivae and EOM are normal. Pupils are equal, round, and reactive to light.  Neck: Normal range of motion. Neck  supple. No JVD present. No tracheal deviation present.  Cardiovascular: Normal rate, regular rhythm, normal heart sounds and intact distal pulses.   Pulmonary/Chest: Effort normal and breath sounds normal.  Abdominal: Soft. Bowel sounds are normal. There is no tenderness.  Musculoskeletal: Normal range of motion. Exhibits no  edema.  Lymphadenopathy:  Has no cervical adenopathy.  Neurological: Pt is alert and oriented to person, place, and time. Pt has normal reflexes. No cranial nerve deficit.  Skin: Skin is warm and dry. No rash noted.  Psychiatric:  Has  normal mood and affect. Behavior is normal.     Assessment & Plan:

## 2011-09-20 ENCOUNTER — Ambulatory Visit: Payer: BC Managed Care – PPO | Admitting: Internal Medicine

## 2011-10-14 ENCOUNTER — Other Ambulatory Visit: Payer: Self-pay | Admitting: Internal Medicine

## 2012-03-20 ENCOUNTER — Encounter: Payer: Self-pay | Admitting: Internal Medicine

## 2012-03-20 ENCOUNTER — Ambulatory Visit (INDEPENDENT_AMBULATORY_CARE_PROVIDER_SITE_OTHER): Payer: BC Managed Care – PPO | Admitting: Internal Medicine

## 2012-03-20 VITALS — BP 108/80 | HR 97 | Temp 98.4°F | Ht 64.0 in | Wt 224.2 lb

## 2012-03-20 DIAGNOSIS — R7302 Impaired glucose tolerance (oral): Secondary | ICD-10-CM

## 2012-03-20 DIAGNOSIS — I10 Essential (primary) hypertension: Secondary | ICD-10-CM

## 2012-03-20 DIAGNOSIS — Z0000 Encounter for general adult medical examination without abnormal findings: Secondary | ICD-10-CM

## 2012-03-20 DIAGNOSIS — M109 Gout, unspecified: Secondary | ICD-10-CM

## 2012-03-20 DIAGNOSIS — R7309 Other abnormal glucose: Secondary | ICD-10-CM

## 2012-03-20 MED ORDER — ALLOPURINOL 100 MG PO TABS: 100.0000 mg | ORAL_TABLET | Freq: Every day | ORAL | Status: DC

## 2012-03-20 MED ORDER — AMLODIPINE BESYLATE 5 MG PO TABS: 5.0000 mg | ORAL_TABLET | Freq: Every day | ORAL | Status: DC

## 2012-03-20 NOTE — Patient Instructions (Addendum)
Continue all other medications as before OK to stay off the losartan for now Please continue your efforts at being more active, low cholesterol diet, and weight control. Please avoid salt in the diet Please check your blood pressure on a regular basis; your goal is to be less than 140/90 Thank you for enrolling in MyChart. Please follow the instructions below to securely access your online medical record. MyChart allows you to send messages to your doctor, view your test results, renew your prescriptions, schedule appointments, and more. To Log into MyChart, please go to [email or web address], and your Username is: [email or web address] Please return in 6 mo with Lab testing done 3-5 days before

## 2012-03-20 NOTE — Assessment & Plan Note (Signed)
stable overall by hx and exam, most recent data reviewed with pt, and pt to continue medical treatment as before BP Readings from Last 3 Encounters:  03/20/12 108/80  09/17/11 122/82  08/16/11 106/72

## 2012-03-20 NOTE — Progress Notes (Signed)
  Subjective:    Patient ID: Peter Rivera, male    DOB: 04-16-1981, 31 y.o.   MRN: [id number]  HPI  Here to f/u; overall doing ok,  Pt denies chest pain, increased sob or doe, wheezing, orthopnea, PND, increased LE swelling, palpitations, dizziness or syncope.  Pt denies new neurological symptoms such as new headache, or facial or extremity weakness or numbness   Pt denies polydipsia, polyuria, or low sugar symptoms such as weakness or confusion improved with po intake.  Pt states overall good compliance with meds, trying to follow lower cholesterol diet, wt overall stable but little exercise however.  Not taking the losartan as did not seem to make any difference. Past Medical History  Diagnosis Date  . Hypertension   . HTN (hypertension) 09/22/2010  . Hypertriglyceridemia 12/29/2010  . Impaired glucose tolerance 03/29/2011  . Acute gouty arthropathy 08/16/2011   No past surgical history on file.  reports that he has never smoked. He does not have any smokeless tobacco history on file. He reports that he drinks alcohol. He reports that he does not use illicit drugs. family history is not on file. No Known Allergies Current Outpatient Prescriptions on File Prior to Visit  Medication Sig Dispense Refill  . [DISCONTINUED] allopurinol (ZYLOPRIM) 100 MG tablet Take 1 tablet (100 mg total) by mouth daily.  90 tablet  3  . [DISCONTINUED] amLODipine (NORVASC) 5 MG tablet TAKE ONE TABLET BY MOUTH EVERY DAY  90 tablet  3  . losartan (COZAAR) 100 MG tablet Take 1 tablet (100 mg total) by mouth daily.  90 tablet  3   Review of Systems All otherwise neg per pt    Objective:   Physical Exam BP 108/80  Pulse 97  Temp 98.4 F (36.9 C) (Oral)  Ht 5\' 4"  (1.626 m)  Wt 224 lb 4 oz (101.719 kg)  BMI 38.49 kg/m2  SpO2 95% Physical Exam  VS noted Constitutional: Pt appears well-developed and well-nourished.  HENT: Head: Normocephalic.  Right Ear: External ear normal.  Left Ear: External ear normal.    Eyes: Conjunctivae and EOM are normal. Pupils are equal, round, and reactive to light.  Neck: Normal range of motion. Neck supple.  Cardiovascular: Normal rate and regular rhythm.   Pulmonary/Chest: Effort normal and breath sounds normal.  Neurological: Pt is alert. Not confused  Skin: Skin is warm. No erythema.  Psychiatric: Pt behavior is normal. Thought content normal.     Assessment & Plan:

## 2012-03-20 NOTE — Assessment & Plan Note (Signed)
stable overall by hx and exam, most recent data reviewed with pt, and pt to continue medical treatment as before Lab Results  Component Value Date   HGBA1C 5.9 09/17/2011

## 2012-06-17 ENCOUNTER — Other Ambulatory Visit: Payer: Self-pay

## 2012-09-12 ENCOUNTER — Other Ambulatory Visit: Payer: Self-pay | Admitting: Internal Medicine

## 2012-09-13 ENCOUNTER — Telehealth: Payer: Self-pay

## 2012-09-13 DIAGNOSIS — M109 Gout, unspecified: Secondary | ICD-10-CM

## 2012-09-13 DIAGNOSIS — I10 Essential (primary) hypertension: Secondary | ICD-10-CM

## 2012-09-13 MED ORDER — LOSARTAN POTASSIUM 100 MG PO TABS: 100.0000 mg | ORAL_TABLET | Freq: Every day | ORAL | Status: DC

## 2012-09-13 MED ORDER — ALLOPURINOL 100 MG PO TABS: 100.0000 mg | ORAL_TABLET | Freq: Every day | ORAL | Status: DC

## 2012-09-13 MED ORDER — AMLODIPINE BESYLATE 5 MG PO TABS: 5.0000 mg | ORAL_TABLET | Freq: Every day | ORAL | Status: DC

## 2012-09-13 NOTE — Telephone Encounter (Signed)
Patient called with refill request

## 2012-09-18 ENCOUNTER — Ambulatory Visit: Payer: BC Managed Care – PPO | Admitting: Internal Medicine

## 2012-09-26 ENCOUNTER — Ambulatory Visit (INDEPENDENT_AMBULATORY_CARE_PROVIDER_SITE_OTHER): Payer: BC Managed Care – PPO | Admitting: Internal Medicine

## 2012-09-26 ENCOUNTER — Encounter: Payer: Self-pay | Admitting: Internal Medicine

## 2012-09-26 VITALS — BP 144/84 | HR 111 | Temp 98.4°F | Ht 64.0 in | Wt 228.2 lb

## 2012-09-26 DIAGNOSIS — Z0000 Encounter for general adult medical examination without abnormal findings: Secondary | ICD-10-CM

## 2012-09-26 DIAGNOSIS — I10 Essential (primary) hypertension: Secondary | ICD-10-CM

## 2012-09-26 DIAGNOSIS — M109 Gout, unspecified: Secondary | ICD-10-CM

## 2012-09-26 MED ORDER — AMLODIPINE BESYLATE 5 MG PO TABS: 5.0000 mg | ORAL_TABLET | Freq: Every day | ORAL | Status: DC

## 2012-09-26 MED ORDER — ALLOPURINOL 100 MG PO TABS: 100.0000 mg | ORAL_TABLET | Freq: Every day | ORAL | Status: DC

## 2012-09-26 MED ORDER — INDOMETHACIN 50 MG PO CAPS: ORAL_CAPSULE | ORAL | Status: DC

## 2012-09-26 MED ORDER — IRBESARTAN 300 MG PO TABS: 300.0000 mg | ORAL_TABLET | Freq: Every day | ORAL | Status: DC

## 2012-09-26 NOTE — Assessment & Plan Note (Signed)

## 2012-09-26 NOTE — Progress Notes (Signed)
  Subjective:    Patient ID: Peter Rivera, male    DOB: 1980-11-02, 32 y.o.   MRN: [id number]  HPI  Here for wellness and f/u;  Overall doing ok;  Pt denies CP, worsening SOB, DOE, wheezing, orthopnea, PND, worsening LE edema, palpitations, dizziness or syncope.  Pt denies neurological change such as new headache, facial or extremity weakness.  Pt denies polydipsia, polyuria, or low sugar symptoms. Pt states overall good compliance with treatment and medications, good tolerability, and has been trying to follow lower cholesterol diet.  Pt denies worsening depressive symptoms, suicidal ideation or panic. No fever, night sweats, wt loss, loss of appetite, or other constitutional symptoms.  Pt states good ability with ADL's, has low fall risk, home safety reviewed and adequate, no other significant changes in hearing or vision, and only occasionally active with exercise. No recent gout episode.  No new complaints Past Medical History  Diagnosis Date  . Hypertension   . HTN (hypertension) 09/22/2010  . Hypertriglyceridemia 12/29/2010  . Impaired glucose tolerance 03/29/2011  . Acute gouty arthropathy 08/16/2011   No past surgical history on file.  reports that he has never smoked. He does not have any smokeless tobacco history on file. He reports that  drinks alcohol. He reports that he does not use illicit drugs. family history is not on file. No Known Allergies No current outpatient prescriptions on file prior to visit.   No current facility-administered medications on file prior to visit.   Review of Systems Constitutional: Negative for diaphoresis, activity change, appetite change or unexpected weight change.  HENT: Negative for hearing loss, ear pain, facial swelling, mouth sores and neck stiffness.   Eyes: Negative for pain, redness and visual disturbance.  Respiratory: Negative for shortness of breath and wheezing.   Cardiovascular: Negative for chest pain and palpitations.   Gastrointestinal: Negative for diarrhea, blood in stool, abdominal distention or other pain Genitourinary: Negative for hematuria, flank pain or change in urine volume.  Musculoskeletal: Negative for myalgias and joint swelling.  Skin: Negative for color change and wound.  Neurological: Negative for syncope and numbness. other than noted Hematological: Negative for adenopathy.  Psychiatric/Behavioral: Negative for hallucinations, self-injury, decreased concentration and agitation.      Objective:   Physical Exam BP 144/84  Pulse 111  Temp(Src) 98.4 F (36.9 C) (Oral)  Ht 5\' 4"  (1.626 m)  Wt 228 lb 4 oz (103.534 kg)  BMI 39.16 kg/m2  SpO2 96% VS noted,  Constitutional: Pt is oriented to person, place, and time. Appears well-developed and well-nourished.  Head: Normocephalic and atraumatic.  Right Ear: External ear normal.  Left Ear: External ear normal.  Nose: Nose normal.  Mouth/Throat: Oropharynx is clear and moist.  Eyes: Conjunctivae and EOM are normal. Pupils are equal, round, and reactive to light.  Neck: Normal range of motion. Neck supple. No JVD present. No tracheal deviation present.  Cardiovascular: Normal rate, regular rhythm, normal heart sounds and intact distal pulses.   Pulmonary/Chest: Effort normal and breath sounds normal.  Abdominal: Soft. Bowel sounds are normal. There is no tenderness. No HSM  Musculoskeletal: Normal range of motion. Exhibits no edema.  Lymphadenopathy:  Has no cervical adenopathy.  Neurological: Pt is alert and oriented to person, place, and time. Pt has normal reflexes. No cranial nerve deficit.  Skin: Skin is warm and dry. No rash noted.  Psychiatric:  Has  normal mood and affect. Behavior is normal.     Assessment & Plan:

## 2012-09-26 NOTE — Patient Instructions (Addendum)
Ok to finish the Losartan (cozaar) that you have left, then stop Please take all new medication as prescribed - the generic avapro   - 1 per day Please continue all other medications as before, and all refills have been done Please continue your efforts at being more active, low cholesterol diet, and weight control. You are otherwise up to date with prevention measures today. Please go to the LAB in the Basement (turn left off the elevator) for the tests to be done today You will be contacted by phone if any changes need to be made immediately.  Otherwise, you will receive a letter about your results with an explanation, but please check with MyChart first.  Thank you for enrolling in MyChart. Please follow the instructions below to securely access your online medical record. MyChart allows you to send messages to your doctor, view your test results, renew your prescriptions, schedule appointments, and more.  Please return in 6 months, or sooner if needed

## 2012-09-26 NOTE — Assessment & Plan Note (Signed)
Mild uncontrolled, for change losartan to avapro 300 qd,  BP Readings from Last 3 Encounters:  09/26/12 144/84  03/20/12 108/80  09/17/11 122/82   F/u BP at home and next visit

## 2013-01-11 ENCOUNTER — Ambulatory Visit: Payer: BC Managed Care – PPO | Admitting: Internal Medicine

## 2013-01-11 DIAGNOSIS — Z0289 Encounter for other administrative examinations: Secondary | ICD-10-CM

## 2013-01-12 ENCOUNTER — Encounter: Payer: Self-pay | Admitting: Internal Medicine

## 2013-01-12 ENCOUNTER — Ambulatory Visit (INDEPENDENT_AMBULATORY_CARE_PROVIDER_SITE_OTHER): Payer: 59 | Admitting: Internal Medicine

## 2013-01-12 ENCOUNTER — Other Ambulatory Visit (INDEPENDENT_AMBULATORY_CARE_PROVIDER_SITE_OTHER): Payer: 59

## 2013-01-12 VITALS — BP 112/80 | HR 105 | Temp 97.2°F | Ht 64.0 in | Wt 229.5 lb

## 2013-01-12 DIAGNOSIS — Z0000 Encounter for general adult medical examination without abnormal findings: Secondary | ICD-10-CM

## 2013-01-12 DIAGNOSIS — R7309 Other abnormal glucose: Secondary | ICD-10-CM

## 2013-01-12 DIAGNOSIS — R7302 Impaired glucose tolerance (oral): Secondary | ICD-10-CM

## 2013-01-12 DIAGNOSIS — E781 Pure hyperglyceridemia: Secondary | ICD-10-CM

## 2013-01-12 DIAGNOSIS — M109 Gout, unspecified: Secondary | ICD-10-CM

## 2013-01-12 DIAGNOSIS — E785 Hyperlipidemia, unspecified: Secondary | ICD-10-CM

## 2013-01-12 LAB — CBC WITH DIFFERENTIAL/PLATELET
Eosinophils Relative: 1.6 % (ref 0.0–5.0)
HCT: 42.8 % (ref 39.0–52.0)
Hemoglobin: 14.6 g/dL (ref 13.0–17.0)
Lymphs Abs: 2 10*3/uL (ref 0.7–4.0)
Monocytes Relative: 9.2 % (ref 3.0–12.0)
Neutro Abs: 5.7 10*3/uL (ref 1.4–7.7)
WBC: 8.6 10*3/uL (ref 4.5–10.5)

## 2013-01-12 LAB — HEMOGLOBIN A1C: Hgb A1c MFr Bld: 5.6 % (ref 4.6–6.5)

## 2013-01-12 LAB — HEPATIC FUNCTION PANEL: Albumin: 3.7 g/dL (ref 3.5–5.2)

## 2013-01-12 LAB — BASIC METABOLIC PANEL
BUN: 15 mg/dL (ref 6–23)
Calcium: 9.1 mg/dL (ref 8.4–10.5)
GFR: 110.84 mL/min (ref >60.00)
Glucose, Bld: 87 mg/dL (ref 70–99)

## 2013-01-12 LAB — LIPID PANEL
HDL: 39 mg/dL — ABNORMAL LOW (ref >39.00)
Triglycerides: 274 mg/dL — ABNORMAL HIGH (ref 0.0–149.0)

## 2013-01-12 LAB — URINALYSIS, ROUTINE W REFLEX MICROSCOPIC
Ketones, ur: NEGATIVE
Leukocytes, UA: NEGATIVE
Specific Gravity, Urine: 1.025 (ref 1.000–1.030)
Urine Glucose: NEGATIVE
pH: 6 (ref 5.0–8.0)

## 2013-01-12 LAB — TSH: TSH: 1.25 u[IU]/mL (ref 0.35–5.50)

## 2013-01-12 LAB — LDL CHOLESTEROL, DIRECT: Direct LDL: 68.7 mg/dL

## 2013-01-12 LAB — URIC ACID: Uric Acid, Serum: 5.9 mg/dL (ref 4.0–7.8)

## 2013-01-12 NOTE — Patient Instructions (Signed)
Please continue all other medications as before, and refills have been done if requested. Please have the pharmacy call with any other refills you may need. Please continue your efforts at being more active, low cholesterol diet, and weight control. You are otherwise up to date with prevention measures today. Please go to the LAB in the Basement (turn left off the elevator) for the tests to be done today You will be contacted by phone if any changes need to be made immediately.  Otherwise, you will receive a letter about your results with an explanation, but please check with MyChart first.  You will be contacted regarding the referral for: Nutrition counseling  Please remember to sign up for My Chart if you have not done so, as this will be important to you in the future with finding out test results, communicating by private email, and scheduling acute appointments online when needed.  Please return in 1 year for your yearly visit, or sooner if needed, with Lab testing done 3-5 days before

## 2013-01-12 NOTE — Progress Notes (Signed)
Subjective:    Patient ID: Peter Rivera, male    DOB: 06/15/1980, 32 y.o.   MRN: [id number]  HPI  Here for wellness and f/u;  Overall doing ok;  Pt denies CP, worsening SOB, DOE, wheezing, orthopnea, PND, worsening LE edema, palpitations, dizziness or syncope.  Pt denies neurological change such as new headache, facial or extremity weakness.  Pt denies polydipsia, polyuria, or low sugar symptoms. Pt states overall good compliance with treatment and medications, good tolerability, and has been trying to follow lower cholesterol diet.  Pt denies worsening depressive symptoms, suicidal ideation or panic. No fever, night sweats, wt loss, loss of appetite, or other constitutional symptoms.  Pt states good ability with ADL's, has low fall risk, home safety reviewed and adequate, no other significant changes in hearing or vision, and only occasionally active with exercise.  Past Medical History  Diagnosis Date  . Hypertension   . HTN (hypertension) 09/22/2010  . Hypertriglyceridemia 12/29/2010  . Impaired glucose tolerance 03/29/2011  . Acute gouty arthropathy 08/16/2011   No past surgical history on file.  reports that he has never smoked. He does not have any smokeless tobacco history on file. He reports that  drinks alcohol. He reports that he does not use illicit drugs. family history is not on file. No Known Allergies Current Outpatient Prescriptions on File Prior to Visit  Medication Sig Dispense Refill  . allopurinol (ZYLOPRIM) 100 MG tablet Take 1 tablet (100 mg total) by mouth daily.  90 tablet  3  . amLODipine (NORVASC) 5 MG tablet Take 1 tablet (5 mg total) by mouth daily.  90 tablet  3  . indomethacin (INDOCIN) 50 MG capsule TAKE ONE CAPSULE BY MOUTH THREE TIMES DAILY WITH MEALS FOR ACUTE FLARE UPS  90 capsule  1  . irbesartan (AVAPRO) 300 MG tablet Take 1 tablet (300 mg total) by mouth at bedtime.  90 tablet  3   No current facility-administered medications on file prior to visit.    Review of Systems Constitutional: Negative for diaphoresis, activity change, appetite change or unexpected weight change.  HENT: Negative for hearing loss, ear pain, facial swelling, mouth sores and neck stiffness.   Eyes: Negative for pain, redness and visual disturbance.  Respiratory: Negative for shortness of breath and wheezing.   Cardiovascular: Negative for chest pain and palpitations.  Gastrointestinal: Negative for diarrhea, blood in stool, abdominal distention or other pain Genitourinary: Negative for hematuria, flank pain or change in urine volume.  Musculoskeletal: Negative for myalgias and joint swelling.  Skin: Negative for color change and wound.  Neurological: Negative for syncope and numbness. other than noted Hematological: Negative for adenopathy.  Psychiatric/Behavioral: Negative for hallucinations, self-injury, decreased concentration and agitation.      Objective:   Physical Exam BP 112/80  Pulse 105  Temp(Src) 97.2 F (36.2 C) (Oral)  Ht 5\' 4"  (1.626 m)  Wt 229 lb 8 oz (104.101 kg)  BMI 39.37 kg/m2  SpO2 97% VS noted,  Constitutional: Pt is oriented to person, place, and time. Appears well-developed and well-nourished.  Head: Normocephalic and atraumatic.  Right Ear: External ear normal.  Left Ear: External ear normal.  Nose: Nose normal.  Mouth/Throat: Oropharynx is clear and moist.  Eyes: Conjunctivae and EOM are normal. Pupils are equal, round, and reactive to light.  Neck: Normal range of motion. Neck supple. No JVD present. No tracheal deviation present.  Cardiovascular: Normal rate, regular rhythm, normal heart sounds and intact distal pulses.   Pulmonary/Chest: Effort normal and  breath sounds normal.  Abdominal: Soft. Bowel sounds are normal. There is no tenderness. No HSM  Musculoskeletal: Normal range of motion. Exhibits no edema.  Lymphadenopathy:  Has no cervical adenopathy.  Neurological: Pt is alert and oriented to person, place, and time.  Pt has normal reflexes. No cranial nerve deficit.  Skin: Skin is warm and dry. No rash noted.  Psychiatric:  Has  normal mood and affect. Behavior is normal.     Assessment & Plan:

## 2013-01-13 NOTE — Assessment & Plan Note (Signed)
stable overall by history and exam, recent data reviewed with pt, and pt to continue medical treatment as before,  to f/u any worsening symptoms or concerns Lab Results  Component Value Date   HGBA1C 5.6 01/12/2013

## 2013-01-13 NOTE — Assessment & Plan Note (Signed)
Pt reqeusts nutrition referral for wt and lipids

## 2013-01-13 NOTE — Assessment & Plan Note (Signed)

## 2013-02-19 ENCOUNTER — Encounter: Payer: Self-pay | Admitting: Dietician

## 2013-02-19 ENCOUNTER — Encounter: Payer: 59 | Attending: Internal Medicine | Admitting: Dietician

## 2013-02-19 VITALS — Ht 64.0 in | Wt 233.3 lb

## 2013-02-19 DIAGNOSIS — Z713 Dietary counseling and surveillance: Secondary | ICD-10-CM | POA: Insufficient documentation

## 2013-02-19 DIAGNOSIS — E669 Obesity, unspecified: Secondary | ICD-10-CM

## 2013-02-19 NOTE — Progress Notes (Signed)
  Medical Nutrition Therapy:  Appt start time: 0800 end time:  0900.   Assessment:  Primary concerns today: Blue is here today since he would like to lose some weight, about 40-45 lbs.   Olivier lives with his wife who does most of the grocery shopping and meal preparation.  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   States his biggest problem is portion sizes and drinking a lot of liquids. Has lost weight in the past by running and cutting back on portion sizes.   MEDICATIONS: see list   DIETARY INTAKE:  Usual eating pattern includes 2-3 meals and 0 snacks per day. Avoided foods include peanut butter, nuts, cinnamon, oatmeal, most fruits   24-hr recall:   Wakes up soon after he wakes up which is around 2:30 PM which is the main meal such as pork chops, spaghetti, fried chicken, or subs with mostly sweet tea.   Will sometimes have make sandwich, [employer], or pizza between 10 PM and 2 AM with water, soda. Will sometimes have cereal with 2% after work before bed.   May have oreos or donuts for snacks.  Beverages: sweet tea at restaurant, or water, diet soda, or kool aid, and 50/50 juice.  Usual physical activity: not structured exercise, but job is physical unloading gas from tanks  Estimated energy needs: 2200 calories 248 g carbohydrates 165 g protein 61 g fat  Progress Towards Goal(s):  In progress.   Nutritional Diagnosis:  NB-1.1 Food and nutrition-related knowledge deficit As related to history of large portions and energy dense food choices.  As evidenced by BMI of 40.1.    Intervention:  Nutrition counseling provided. Recommended that Carmichael pick one item to change about his diet at a time. Encouraged him to set up his environment so making healthy choices is easier. Suggested eating more frequently to help avoid overeating at meal times.   Plan: Aim to eat a meal or snack every 3-5 hours you are awake. Bring snacks with you when you're working with  protein. Fill up half of your plate with vegetables. Have protein the size of the palm of your hand. Bake, broil, and grill meats. Drink mostly water or low calorie (sugar free) drinks.  Aim to get exercise with a goal of 30 minutes 5 x weeks.  Handouts given during visit include:  MyPlate Handout  16X CHO snacks  Yellow Card  Monitoring/Evaluation:  Dietary intake, exercise, and body weight in 6 week(s).

## 2013-02-19 NOTE — Patient Instructions (Signed)
Aim to eat a meal or snack every 3-5 hours you are awake. Bring snacks with you when you're working with protein. Fill up half of your plate with vegetables. Have protein the size of the palm of your hand. Bake, broil, and grill meats. Drink mostly water or low calorie (sugar free) drinks.  Aim to get exercise with a goal of 30 minutes 5 x weeks.

## 2013-03-08 ENCOUNTER — Other Ambulatory Visit: Payer: Self-pay

## 2013-04-02 ENCOUNTER — Encounter: Payer: 59 | Attending: Internal Medicine | Admitting: Dietician

## 2013-04-02 VITALS — Ht 64.0 in | Wt 231.2 lb

## 2013-04-02 DIAGNOSIS — Z713 Dietary counseling and surveillance: Secondary | ICD-10-CM | POA: Insufficient documentation

## 2013-04-02 DIAGNOSIS — E669 Obesity, unspecified: Secondary | ICD-10-CM | POA: Insufficient documentation

## 2013-04-02 NOTE — Progress Notes (Signed)
  Medical Nutrition Therapy:  Appt start time: 0930 end time:  1000.   Assessment:  Primary concerns today: Peter Rivera is here for a follow up for weight loss today. Lost two pounds since last visit. Eating  More frequently and his portions are smaller. Drinking more water and about 1 soda per day.  States his wife is portioning out his meals and snacks for him, he is eating out less frequently, and having less sweets.   Wt Readings from Last 3 Encounters:  04/02/13 231 lb 3.2 oz (104.872 kg)  02/19/13 233 lb 4.8 oz (105.824 kg)  01/12/13 229 lb 8 oz (104.101 kg)   Ht Readings from Last 3 Encounters:  04/02/13 5\' 4"  (1.626 m)  02/19/13 5\' 4"  (1.626 m)  01/12/13 5\' 4"  (1.626 m)   Body mass index is 39.67 kg/(m^2). @BMIFA @ Normalized weight-for-age data available only for age 64 to 20 years. Normalized stature-for-age data available only for age 64 to 20 years.   MEDICATIONS: see list   DIETARY INTAKE:  Avoided foods include peanut butter, nuts, cinnamon, oatmeal, most fruits   24-hr recall:   Bringing sandwiches, grapes, string cheese, and maybe a moon pie to bring to eat throughout the night.   Wakes up soon after he wakes up which is around 2:30 PM which is the main meal which is mostly chicken with vegetables.Will sometimes have cereal with 2% after work before bed.    Beverages: sweet tea at restaurant, or water, diet soda, or kool aid, and 50/50 juice.  Usual physical activity: not structured exercise, but job is physical unloading gas from tanks  Estimated energy needs: 2200 calories 248 g carbohydrates 165 g protein 61 g fat  Progress Towards Goal(s):  In progress.   Nutritional Diagnosis:  NB-1.1 Food and nutrition-related knowledge deficit As related to history of large portions and energy dense food choices.  As evidenced by BMI of 40.1.    Intervention:  Nutrition counseling provided. Recommended that Advaith keep up the good work and consider working on drinking  mostly water instead of sugar sweetened drinks.   Plan: Aim to eat a meal or snack every 3-5 hours you are awake. Bring snacks with you when you're working with protein. Fill up half of your plate with vegetables. Have protein the size of the palm of your hand. Bake, broil, and grill meats. Drink mostly water or low calorie (sugar free) drinks.  Aim to get exercise with a goal of 30 minutes 5 x weeks.   Monitoring/Evaluation:  Dietary intake, exercise, and body weight in 2 month(s).

## 2013-04-02 NOTE — Patient Instructions (Addendum)
Aim to eat a meal or snack every 3-5 hours you are awake. Bring snacks with you when you're working with protein. Fill up half of your plate with vegetables. Have protein the size of the palm of your hand. Bake, broil, and grill meats. Drink mostly water or low calorie (sugar free) drinks.  Aim to get exercise with a goal of 30 minutes 5 x weeks. 

## 2013-04-03 ENCOUNTER — Ambulatory Visit (INDEPENDENT_AMBULATORY_CARE_PROVIDER_SITE_OTHER): Payer: 59 | Admitting: Internal Medicine

## 2013-04-03 ENCOUNTER — Encounter: Payer: Self-pay | Admitting: Internal Medicine

## 2013-04-03 ENCOUNTER — Ambulatory Visit: Payer: BC Managed Care – PPO | Admitting: Internal Medicine

## 2013-04-03 VITALS — BP 130/82 | HR 93 | Temp 98.4°F | Ht 64.0 in | Wt 226.2 lb

## 2013-04-03 DIAGNOSIS — E781 Pure hyperglyceridemia: Secondary | ICD-10-CM

## 2013-04-03 DIAGNOSIS — R7302 Impaired glucose tolerance (oral): Secondary | ICD-10-CM

## 2013-04-03 DIAGNOSIS — Z23 Encounter for immunization: Secondary | ICD-10-CM

## 2013-04-03 DIAGNOSIS — I10 Essential (primary) hypertension: Secondary | ICD-10-CM

## 2013-04-03 DIAGNOSIS — R7309 Other abnormal glucose: Secondary | ICD-10-CM

## 2013-04-03 NOTE — Addendum Note (Signed)
Addended by: Scharlene Gloss B on: 04/03/2013 09:21 AM   Modules accepted: Orders

## 2013-04-03 NOTE — Assessment & Plan Note (Signed)
stable overall by history and exam, recent data reviewed with pt, and pt to continue medical treatment as before,  to f/u any worsening symptoms or concerns Lab Results  Component Value Date   HGBA1C 5.6 01/12/2013

## 2013-04-03 NOTE — Assessment & Plan Note (Signed)
stable overall by history and exam, recent data reviewed with pt, and pt to continue medical treatment as before,  to f/u any worsening symptoms or concerns BP Readings from Last 3 Encounters:  04/03/13 130/82  01/12/13 112/80  09/26/12 144/84

## 2013-04-03 NOTE — Assessment & Plan Note (Signed)
D/w pt - for wt loss, low fat diet Lab Results  Component Value Date   CHOL 142 01/12/2013   HDL 39.00* 01/12/2013   LDLCALC 87 03/29/2011   LDLDIRECT 68.7 01/12/2013   TRIG 274.0* 01/12/2013   CHOLHDL 4 01/12/2013

## 2013-04-03 NOTE — Progress Notes (Signed)
   Subjective:    Patient ID: Peter Rivera, male    DOB: 1980-11-20, 32 y.o.   MRN: [id number]  HPI  Here to f/u; overall doing ok,  Pt denies chest pain, increased sob or doe, wheezing, orthopnea, PND, increased LE swelling, palpitations, dizziness or syncope.  Pt denies polydipsia, polyuria, or low sugar symptoms such as weakness or confusion improved with po intake.  Pt denies new neurological symptoms such as new headache, or facial or extremity weakness or numbness.   Pt states overall good compliance with meds, has been trying to follow lower cholesterol, diabetic diet, with wt overall stable,  but little exercise however.  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  No new complaints.  Passed his recent DOT physical, no sleep apnea symptoms Past Medical History  Diagnosis Date  . Hypertension   . HTN (hypertension) 09/22/2010  . Hypertriglyceridemia 12/29/2010  . Impaired glucose tolerance 03/29/2011  . Acute gouty arthropathy 08/16/2011   No past surgical history on file.  reports that he has never smoked. He does not have any smokeless tobacco history on file. He reports that he drinks alcohol. He reports that he does not use illicit drugs. family history includes Diabetes in his other. No Known Allergies Current Outpatient Prescriptions on File Prior to Visit  Medication Sig Dispense Refill  . allopurinol (ZYLOPRIM) 100 MG tablet Take 1 tablet (100 mg total) by mouth daily.  90 tablet  3  . amLODipine (NORVASC) 5 MG tablet Take 1 tablet (5 mg total) by mouth daily.  90 tablet  3  . indomethacin (INDOCIN) 50 MG capsule TAKE ONE CAPSULE BY MOUTH THREE TIMES DAILY WITH MEALS FOR ACUTE FLARE UPS  90 capsule  1  . irbesartan (AVAPRO) 300 MG tablet Take 1 tablet (300 mg total) by mouth at bedtime.  90 tablet  3   No current facility-administered medications on file prior to visit.   Review of Systems  Constitutional: Negative for unexpected weight change, or unusual diaphoresis  HENT:  Negative for tinnitus.   Eyes: Negative for photophobia and visual disturbance.  Respiratory: Negative for choking and stridor.   Gastrointestinal: Negative for vomiting and blood in stool.  Genitourinary: Negative for hematuria and decreased urine volume.  Musculoskeletal: Negative for acute joint swelling Skin: Negative for color change and wound.  Neurological: Negative for tremors and numbness other than noted  Psychiatric/Behavioral: Negative for decreased concentration or  hyperactivity.       Objective:   Physical Exam BP 130/82  Pulse 93  Temp(Src) 98.4 F (36.9 C) (Oral)  Ht 5\' 4"  (1.626 m)  Wt 226 lb 4 oz (102.626 kg)  BMI 38.82 kg/m2  SpO2 97% VS noted,  Constitutional: Pt appears well-developed and well-nourished.  HENT: Head: NCAT.  Right Ear: External ear normal.  Left Ear: External ear normal.  Eyes: Conjunctivae and EOM are normal. Pupils are equal, round, and reactive to light.  Neck: Normal range of motion. Neck supple.  Cardiovascular: Normal rate and regular rhythm.   Pulmonary/Chest: Effort normal and breath sounds normal.  Abd:  Soft, NT, non-distended, + BS Neurological: Pt is alert. Not confused  Skin: Skin is warm. No erythema.  Psychiatric: Pt behavior is normal. Thought content normal.     Assessment & Plan:

## 2013-04-03 NOTE — Patient Instructions (Addendum)
You had the flu shot today Please continue all other medications as before, and refills have been done if requested. Please have the pharmacy call with any other refills you may need. Please continue your efforts at being more active, low cholesterol diet, and weight control.  Please return in 9 months for your yearly visit, or sooner if needed, with Lab testing done 3-5 days before

## 2013-06-04 ENCOUNTER — Encounter: Payer: 59 | Attending: Internal Medicine | Admitting: Dietician

## 2013-06-04 VITALS — Ht 64.0 in | Wt 226.3 lb

## 2013-06-04 DIAGNOSIS — E669 Obesity, unspecified: Secondary | ICD-10-CM | POA: Insufficient documentation

## 2013-06-04 DIAGNOSIS — Z713 Dietary counseling and surveillance: Secondary | ICD-10-CM | POA: Insufficient documentation

## 2013-06-04 NOTE — Progress Notes (Signed)
  Medical Nutrition Therapy:  Appt start time: 0830 end time:  900.  Assessment:  Primary concerns today: Peter Rivera is here for a follow up for weight loss today. Lost 5 pounds since last visit. States that he "hasn't been doing what he's supposed to be doing". Just started going back to working on smaller portion sizes, limiting soda, having less sweets, and having his wife portion out meals and snacks for him. Still drinking sweetened ginger ale and green tea while working.   Joined the gym last week and working out 2 x week.     Wt Readings from Last 3 Encounters:  06/04/13 226 lb 4.8 oz (102.649 kg)  04/03/13 226 lb 4 oz (102.626 kg)  04/02/13 231 lb 3.2 oz (104.872 kg)   Ht Readings from Last 3 Encounters:  06/04/13 5\' 4"  (1.626 m)  04/03/13 5\' 4"  (1.626 m)  04/02/13 5\' 4"  (1.626 m)   Body mass index is 38.83 kg/(m^2). @BMIFA @ Normalized weight-for-age data available only for age 68 to 20 years. Normalized stature-for-age data available only for age 68 to 20 years.   MEDICATIONS: see list   DIETARY INTAKE:  Avoided foods include peanut butter, nuts, cinnamon, oatmeal, most fruits   24-hr recall:   Bringing sandwiches, applesauce, grapes, string cheese, and maybe a moon pie to bring to eat throughout the night.   Wakes up soon after he wakes up which is around 2:30 PM which is the main meal which is mostly chicken with vegetables.Will sometimes have cereal with 2% after work before bed.    Beverages: sweet green tea, sweet tea at restaurant, water, diet soda, or kool aid, 2% milk, and 50/50 juice.  Usual physical activity: joined the gym and going about 2 x week for 60-90 minute  Estimated energy needs: 2200 calories 248 g carbohydrates 165 g protein 61 g fat  Progress Towards Goal(s):  In progress.   Nutritional Diagnosis:  NB-1.1 Food and nutrition-related knowledge deficit As related to history of large portions and energy dense food choices.  As evidenced by BMI of  40.1.    Intervention:  Nutrition counseling provided.   Plan: Aim to eat a meal or snack every 3-5 hours you are awake. Bring snacks with you when you're working with protein. Fill up half of your plate with vegetables. Continue mostly having baked meats instead of fried. Plan to eat out 2 x week (instead of 4 x week).  Consider switching to 1% milk from 2%.  Drink mostly water or low calorie (sugar free) drinks.  Aim to get exercise with a goal of 60-90 minutes 3 x week.    Monitoring/Evaluation:  Dietary intake, exercise, and body weight in 3 month(s).

## 2013-06-04 NOTE — Patient Instructions (Addendum)
Aim to eat a meal or snack every 3-5 hours you are awake. Bring snacks with you when you're working with protein. Fill up half of your plate with vegetables. Continue mostly having baked meats instead of fried. Plan to eat out 2 x week (instead of 4 x week).  Consider switching to 1% milk from 2%.  Drink mostly water or low calorie (sugar free) drinks.  Aim to get exercise with a goal of 60-90 minutes 3 x week.

## 2013-08-03 ENCOUNTER — Other Ambulatory Visit: Payer: Self-pay | Admitting: Internal Medicine

## 2013-08-06 NOTE — Telephone Encounter (Signed)
Refill request for indomethacin Last filled by MD on - 09/26/2012 #90 x1 Last Appt:01/12/2013 Next Appt: 01/16/2014 Please advise refill?

## 2013-09-10 ENCOUNTER — Ambulatory Visit: Payer: 59 | Admitting: Dietician

## 2013-10-06 ENCOUNTER — Other Ambulatory Visit: Payer: Self-pay | Admitting: Internal Medicine

## 2013-10-11 ENCOUNTER — Other Ambulatory Visit: Payer: Self-pay

## 2013-10-11 MED ORDER — IRBESARTAN 300 MG PO TABS: 300.0000 mg | ORAL_TABLET | Freq: Every day | ORAL | Status: DC

## 2013-10-11 MED ORDER — AMLODIPINE BESYLATE 5 MG PO TABS: 5.0000 mg | ORAL_TABLET | Freq: Every day | ORAL | Status: DC

## 2013-10-11 MED ORDER — ALLOPURINOL 100 MG PO TABS: 100.0000 mg | ORAL_TABLET | Freq: Every day | ORAL | Status: DC

## 2013-10-23 ENCOUNTER — Ambulatory Visit (INDEPENDENT_AMBULATORY_CARE_PROVIDER_SITE_OTHER): Payer: BC Managed Care – PPO | Admitting: Podiatry

## 2013-10-23 ENCOUNTER — Encounter: Payer: Self-pay | Admitting: Podiatry

## 2013-10-23 VITALS — BP 137/88 | HR 91 | Resp 16 | Ht 64.0 in | Wt 210.0 lb

## 2013-10-23 DIAGNOSIS — L608 Other nail disorders: Secondary | ICD-10-CM

## 2013-10-23 NOTE — Progress Notes (Signed)
   Subjective:    Patient ID: Peter Rivera, male    DOB: 01/19/1981, 33 y.o.   MRN: [id number]  HPI Comments: "I have bad looking nails"  Patient c/o thick, discolored 1-5 toenails right foot and 4th and 5th left foot for about 2 years. He has tried lotrimin with no help. Tries to keep trimmed. They are not sore.     Review of Systems  All other systems reviewed and are negative.      Objective:   Physical Exam: I have reviewed her past medical history medications allergies surgeries social history and review of systems. Pulses are strongly palpable bilateral capillary fill time to digits one through 5 is immediate. Neurologic sensorium is intact per since once the monofilament. Deep tendon reflexes are intact bilateral muscle strength is 5 over 5 dorsiflexors plantar flexors inverters everters all intrinsic musculature is intact. Orthopedic evaluation does demonstrate mild pes planus and mild hammertoe deformities mild HAV deformities bilateral but are all asymptomatic. Cutaneous evaluation demonstrates supple well hydrated cutis no erythema edema saline is drainage or odor. Hallux nail plates bilaterally do demonstrate nail dystrophy and possible onychomycosis. He is possibly a mild tinea pedis to the plantar aspect of the bilateral foot.        Assessment & Plan:  Assessment: Nail dystrophy rule out onychomycosis hallux bilateral. Tinea pedis  Plan: Discussed etiology pathology conservative versus surgical therapies. At this point we sample of his hallux nails bilaterally we will send those for histopathologic evaluation and will notify him once these returned.

## 2013-10-23 NOTE — Patient Instructions (Signed)

## 2013-10-25 ENCOUNTER — Telehealth: Payer: Self-pay | Admitting: *Deleted

## 2013-10-25 NOTE — Telephone Encounter (Signed)
Mycotic nail, right hallux, [facility].  [facility] [address] [place], [place] [id number] Ph: [phone]

## 2013-11-13 ENCOUNTER — Encounter: Payer: Self-pay | Admitting: Podiatry

## 2013-11-29 ENCOUNTER — Encounter: Payer: Self-pay | Admitting: Podiatry

## 2013-11-29 ENCOUNTER — Ambulatory Visit (INDEPENDENT_AMBULATORY_CARE_PROVIDER_SITE_OTHER): Payer: BC Managed Care – PPO | Admitting: Podiatry

## 2013-11-29 VITALS — BP 176/96 | HR 98 | Resp 16

## 2013-11-29 DIAGNOSIS — Z79899 Other long term (current) drug therapy: Secondary | ICD-10-CM

## 2013-11-29 DIAGNOSIS — B351 Tinea unguium: Secondary | ICD-10-CM

## 2013-11-29 LAB — HEPATIC FUNCTION PANEL
ALT: 20 U/L (ref 0–53)
AST: 19 U/L (ref 0–37)
Albumin: 3.9 g/dL (ref 3.5–5.2)
Alkaline Phosphatase: 101 U/L (ref 39–117)
BILIRUBIN DIRECT: 0.1 mg/dL (ref 0.0–0.3)
Indirect Bilirubin: 0.3 mg/dL (ref 0.2–1.2)
Total Bilirubin: 0.4 mg/dL (ref 0.2–1.2)
Total Protein: 7.4 g/dL (ref 6.0–8.3)

## 2013-11-29 MED ORDER — TERBINAFINE HCL 250 MG PO TABS: 250.0000 mg | ORAL_TABLET | Freq: Every day | ORAL | Status: DC

## 2013-11-29 NOTE — Progress Notes (Signed)
He presents today for followup of a positive Tinel culture.  Objective: Onychomycosis bilateral.  Assessment: Onychomycosis.  Plan: We requested liver profile and CBC be performed. Started him on Lamisil 250 mg tablets 1 by mouth daily x30 with no refills followup with him in one month discussed the pros and cons of this oral antifungal and the possible complications associated with it.

## 2013-11-29 NOTE — Patient Instructions (Signed)

## 2013-12-27 ENCOUNTER — Ambulatory Visit (INDEPENDENT_AMBULATORY_CARE_PROVIDER_SITE_OTHER): Payer: BC Managed Care – PPO | Admitting: Podiatry

## 2013-12-27 ENCOUNTER — Encounter: Payer: Self-pay | Admitting: Podiatry

## 2013-12-27 DIAGNOSIS — Z79899 Other long term (current) drug therapy: Secondary | ICD-10-CM

## 2013-12-27 LAB — CBC WITH DIFFERENTIAL/PLATELET
Basophils Absolute: 0.1 10*3/uL (ref 0.0–0.1)
Basophils Relative: 1 % (ref 0–1)
Eosinophils Absolute: 0.2 10*3/uL (ref 0.0–0.7)
Eosinophils Relative: 2 % (ref 0–5)
HEMATOCRIT: 40.9 % (ref 39.0–52.0)
Hemoglobin: 14 g/dL (ref 13.0–17.0)
LYMPHS ABS: 1.9 10*3/uL (ref 0.7–4.0)
LYMPHS PCT: 22 % (ref 12–46)
MCH: 24.8 pg — ABNORMAL LOW (ref 26.0–34.0)
MCHC: 34.2 g/dL (ref 30.0–36.0)
MCV: 72.5 fL — AB (ref 78.0–100.0)
MONO ABS: 0.6 10*3/uL (ref 0.1–1.0)
Monocytes Relative: 7 % (ref 3–12)
Neutro Abs: 5.8 10*3/uL (ref 1.7–7.7)
Neutrophils Relative %: 68 % (ref 43–77)
Platelets: 328 10*3/uL (ref 150–400)
RBC: 5.64 MIL/uL (ref 4.22–5.81)
RDW: 15 % (ref 11.5–15.5)
WBC: 8.5 10*3/uL (ref 4.0–10.5)

## 2013-12-27 MED ORDER — TERBINAFINE HCL 250 MG PO TABS: 250.0000 mg | ORAL_TABLET | Freq: Every day | ORAL | Status: DC

## 2013-12-27 NOTE — Progress Notes (Signed)
He presents for followup of his Lamisil therapy today he states it is doing great the problems with.  Objective: Nails appear to be growing out by approximately 50%.  Assessment: Well-healing onychomycosis with the use of Lamisil.  Plan: Continue use of Lamisil dispensed another prescription for 200 mg tablets #30 take 1 by mouth every other day. Followup with him 5 months.

## 2013-12-28 LAB — HEPATIC FUNCTION PANEL
ALK PHOS: 112 U/L (ref 39–117)
ALT: 23 U/L (ref 0–53)
AST: 22 U/L (ref 0–37)
Albumin: 4 g/dL (ref 3.5–5.2)
BILIRUBIN INDIRECT: 0.4 mg/dL (ref 0.2–1.2)
Bilirubin, Direct: 0.1 mg/dL (ref 0.0–0.3)
Total Bilirubin: 0.5 mg/dL (ref 0.2–1.2)
Total Protein: 7.6 g/dL (ref 6.0–8.3)

## 2014-01-01 ENCOUNTER — Telehealth: Payer: Self-pay | Admitting: *Deleted

## 2014-01-01 NOTE — Telephone Encounter (Signed)
Informed patient of blood work results.

## 2014-01-01 NOTE — Telephone Encounter (Signed)
Message copied by Bing Ree on Tue Jan 01, 2014 11:33 AM ------      Message from: Ernestene Kiel T      Created: Fri Dec 28, 2013  1:15 PM       Liver looks good. Continue medication as prescribed. ------

## 2014-01-16 ENCOUNTER — Other Ambulatory Visit (INDEPENDENT_AMBULATORY_CARE_PROVIDER_SITE_OTHER): Payer: BC Managed Care – PPO

## 2014-01-16 ENCOUNTER — Encounter: Payer: 59 | Admitting: Internal Medicine

## 2014-01-16 ENCOUNTER — Ambulatory Visit (INDEPENDENT_AMBULATORY_CARE_PROVIDER_SITE_OTHER): Payer: BC Managed Care – PPO | Admitting: Internal Medicine

## 2014-01-16 ENCOUNTER — Encounter: Payer: Self-pay | Admitting: Internal Medicine

## 2014-01-16 VITALS — BP 122/86 | HR 94 | Temp 97.6°F | Ht 64.0 in | Wt 221.4 lb

## 2014-01-16 DIAGNOSIS — Z0000 Encounter for general adult medical examination without abnormal findings: Secondary | ICD-10-CM

## 2014-01-16 DIAGNOSIS — R7302 Impaired glucose tolerance (oral): Secondary | ICD-10-CM

## 2014-01-16 DIAGNOSIS — R7309 Other abnormal glucose: Secondary | ICD-10-CM

## 2014-01-16 DIAGNOSIS — Z23 Encounter for immunization: Secondary | ICD-10-CM

## 2014-01-16 LAB — BASIC METABOLIC PANEL
BUN: 19 mg/dL (ref 6–23)
CALCIUM: 8.9 mg/dL (ref 8.4–10.5)
CO2: 24 mEq/L (ref 19–32)
CREATININE: 1.2 mg/dL (ref 0.4–1.5)
Chloride: 106 mEq/L (ref 96–112)
GFR: 88.4 mL/min (ref >60.00)
Glucose, Bld: 97 mg/dL (ref 70–99)
Potassium: 3.9 mEq/L (ref 3.5–5.1)
Sodium: 137 mEq/L (ref 135–145)

## 2014-01-16 LAB — URINALYSIS, ROUTINE W REFLEX MICROSCOPIC
Bilirubin Urine: NEGATIVE
Hgb urine dipstick: NEGATIVE
Ketones, ur: NEGATIVE
Leukocytes, UA: NEGATIVE
Nitrite: NEGATIVE
RBC / HPF: NONE SEEN (ref 0–?)
Specific Gravity, Urine: 1.02 (ref 1.000–1.030)
Total Protein, Urine: NEGATIVE
URINE GLUCOSE: NEGATIVE
Urobilinogen, UA: 0.2 (ref 0.0–1.0)
WBC UA: NONE SEEN (ref 0–?)
pH: 6 (ref 5.0–8.0)

## 2014-01-16 LAB — LIPID PANEL
CHOL/HDL RATIO: 4
Cholesterol: 168 mg/dL (ref 0–200)
HDL: 41.6 mg/dL (ref >39.00)
LDL CALC: 100 mg/dL — AB (ref 0–99)
NonHDL: 126.4
Triglycerides: 131 mg/dL (ref 0.0–149.0)
VLDL: 26.2 mg/dL (ref 0.0–40.0)

## 2014-01-16 LAB — HEMOGLOBIN A1C: HEMOGLOBIN A1C: 5.9 % (ref 4.6–6.5)

## 2014-01-16 LAB — TSH: TSH: 0.58 u[IU]/mL (ref 0.35–4.50)

## 2014-01-16 NOTE — Progress Notes (Signed)
Pre visit review using our clinic review tool, if applicable. No additional management support is needed unless otherwise documented below in the visit note. 

## 2014-01-16 NOTE — Assessment & Plan Note (Signed)
Asymtp, for wt loss, check f.u a1c

## 2014-01-16 NOTE — Patient Instructions (Addendum)

## 2014-01-16 NOTE — Progress Notes (Signed)
Subjective:    Patient ID: Peter Rivera, male    DOB: [birth year], 33 y.o.   MRN: [id number]  HPI  Here for wellness and f/u;  Overall doing ok;  Pt denies CP, worsening SOB, DOE, wheezing, orthopnea, PND, worsening LE edema, palpitations, dizziness or syncope.  Pt denies neurological change such as new headache, facial or extremity weakness.  Pt denies polydipsia, polyuria, or low sugar symptoms. Pt states overall good compliance with treatment and medications, good tolerability, and has been trying to follow lower cholesterol diet.  Pt denies worsening depressive symptoms, suicidal ideation or panic. No fever, night sweats, wt loss, loss of appetite, or other constitutional symptoms.  Pt states good ability with ADL's, has low fall risk, home safety reviewed and adequate, no other significant changes in hearing or vision, and only occasionally active with exercise, but plans to start outside biking soon. 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  Had recent cbc, lft's normal recent aug 25 at [facility] lab per podiatry, now on lamisil Past Medical History  Diagnosis Date  . Hypertension   . HTN (hypertension) 09/22/2010  . Hypertriglyceridemia 12/29/2010  . Impaired glucose tolerance 03/29/2011  . Acute gouty arthropathy 08/16/2011   No past surgical history on file.  reports that he has never smoked. He does not have any smokeless tobacco history on file. He reports that he drinks alcohol. He reports that he does not use illicit drugs. family history includes Diabetes in his other. No Known Allergies Current Outpatient Prescriptions on File Prior to Visit  Medication Sig Dispense Refill  . allopurinol (ZYLOPRIM) 100 MG tablet Take 1 tablet (100 mg total) by mouth daily.  90 tablet  2  . amLODipine (NORVASC) 5 MG tablet Take 1 tablet (5 mg total) by mouth daily.  90 tablet  2  . indomethacin (INDOCIN) 50 MG capsule TAKE 1 CAPSULE BY MOUTH 3 TIMES A DAY WITH  MEALS FOR ACUTE FLARE UPS.  90 capsule  1  . irbesartan (AVAPRO) 300 MG tablet Take 1 tablet (300 mg total) by mouth daily.  90 tablet  2  . Multiple Vitamin (MULTIVITAMIN) tablet Take 1 tablet by mouth daily.      Marland Kitchen terbinafine (LAMISIL) 250 MG tablet Take 1 tablet (250 mg total) by mouth daily.  30 tablet  0  . terbinafine (LAMISIL) 250 MG tablet Take 1 tablet (250 mg total) by mouth daily.  90 tablet  0   No current facility-administered medications on file prior to visit.   Review of Systems Constitutional: Negative for increased diaphoresis, other activity, appetite or other siginficant weight change  HENT: Negative for worsening hearing loss, ear pain, facial swelling, mouth sores and neck stiffness.   Eyes: Negative for other worsening pain, redness or visual disturbance.  Respiratory: Negative for shortness of breath and wheezing.   Cardiovascular: Negative for chest pain and palpitations.  Gastrointestinal: Negative for diarrhea, blood in stool, abdominal distention or other pain Genitourinary: Negative for hematuria, flank pain or change in urine volume.  Musculoskeletal: Negative for myalgias or other joint complaints.  Skin: Negative for color change and wound.  Neurological: Negative for syncope and numbness. other than noted Hematological: Negative for adenopathy. or other swelling Psychiatric/Behavioral: Negative for hallucinations, self-injury, decreased concentration or other worsening agitation.      Objective:   Physical Exam BP 122/86  Pulse 94  Temp(Src) 97.6 F (36.4 C) (Oral)  Ht  (1.626 m)  Wt 221 lb  6 oz (100.415 kg)  BMI 37.98 kg/m2  SpO2 97% VS noted,  Constitutional: Pt is oriented to person, place, and time. Appears well-developed and well-nourished.  Head: Normocephalic and atraumatic.  Right Ear: External ear normal.  Left Ear: External ear normal.  Nose: Nose normal.  Mouth/Throat: Oropharynx is clear and moist.  Eyes: Conjunctivae and EOM  are normal. Pupils are equal, round, and reactive to light.  Neck: Normal range of motion. Neck supple. No JVD present. No tracheal deviation present.  Cardiovascular: Normal rate, regular rhythm, normal heart sounds and intact distal pulses.   Pulmonary/Chest: Effort normal and breath sounds without rales or wheezing  Abdominal: Soft. Bowel sounds are normal. NT. No HSM  Musculoskeletal: Normal range of motion. Exhibits no edema.  Lymphadenopathy:  Has no cervical adenopathy.  Neurological: Pt is alert and oriented to person, place, and time. Pt has normal reflexes. No cranial nerve deficit. Motor grossly intact Skin: Skin is warm and dry. No rash noted.  Psychiatric:  Has normal mood and affect. Behavior is normal.     Assessment & Plan:

## 2014-01-16 NOTE — Assessment & Plan Note (Signed)

## 2014-03-14 ENCOUNTER — Other Ambulatory Visit: Payer: Self-pay | Admitting: Internal Medicine

## 2014-04-30 ENCOUNTER — Ambulatory Visit: Payer: BC Managed Care – PPO | Admitting: Podiatry

## 2014-06-21 ENCOUNTER — Other Ambulatory Visit: Payer: Self-pay | Admitting: Internal Medicine

## 2014-07-05 ENCOUNTER — Ambulatory Visit (INDEPENDENT_AMBULATORY_CARE_PROVIDER_SITE_OTHER): Payer: 59 | Admitting: Internal Medicine

## 2014-07-05 ENCOUNTER — Encounter: Payer: Self-pay | Admitting: Internal Medicine

## 2014-07-05 VITALS — BP 132/88 | HR 100 | Temp 98.6°F | Resp 20 | Ht 64.0 in | Wt 242.0 lb

## 2014-07-05 DIAGNOSIS — R7302 Impaired glucose tolerance (oral): Secondary | ICD-10-CM

## 2014-07-05 DIAGNOSIS — E781 Pure hyperglyceridemia: Secondary | ICD-10-CM

## 2014-07-05 DIAGNOSIS — I10 Essential (primary) hypertension: Secondary | ICD-10-CM

## 2014-07-05 DIAGNOSIS — Z0189 Encounter for other specified special examinations: Secondary | ICD-10-CM

## 2014-07-05 DIAGNOSIS — Z0000 Encounter for general adult medical examination without abnormal findings: Secondary | ICD-10-CM

## 2014-07-05 NOTE — Assessment & Plan Note (Signed)
stable overall by history and exam, recent data reviewed with pt, and pt to continue medical treatment as before,  to f/u any worsening symptoms or concerns Lab Results  Component Value Date   CHOL 168 01/16/2014   HDL 41.60 01/16/2014   LDLCALC 100* 01/16/2014   LDLDIRECT 68.7 01/12/2013   TRIG 131.0 01/16/2014   CHOLHDL 4 01/16/2014

## 2014-07-05 NOTE — Progress Notes (Signed)
Subjective:    Patient ID: Peter Rivera, male    DOB: 12/21/1980, 34 y.o.   MRN: [id number]  HPI  Here to f/u; overall doing ok,  Pt denies chest pain, increased sob or doe, wheezing, orthopnea, PND, increased LE swelling, palpitations, dizziness or syncope.  Pt denies polydipsia, polyuria, or low sugar symptoms such as weakness or confusion improved with po intake.  Pt denies new neurological symptoms such as new headache, or facial or extremity weakness or numbness.   Pt states overall good compliance with meds, has been trying to follow lower cholesterol diet,,  but little exercise however with more work recently, gained 30 lbs since June 2015, plans to start doing better , has already lost a couple. No sleep apnea symptoms.  Declines any med change for BP Past Medical History  Diagnosis Date  . Hypertension   . HTN (hypertension) 09/22/2010  . Hypertriglyceridemia 12/29/2010  . Impaired glucose tolerance 03/29/2011  . Acute gouty arthropathy 08/16/2011   No past surgical history on file.  reports that he has never smoked. He does not have any smokeless tobacco history on file. He reports that he drinks alcohol. He reports that he does not use illicit drugs. family history includes Diabetes in his other. No Known Allergies Current Outpatient Prescriptions on File Prior to Visit  Medication Sig Dispense Refill  . allopurinol (ZYLOPRIM) 100 MG tablet Take 1 tablet (100 mg total) by mouth daily. 90 tablet 2  . amLODipine (NORVASC) 5 MG tablet Take 1 tablet (5 mg total) by mouth daily. 90 tablet 2  . indomethacin (INDOCIN) 50 MG capsule TAKE 1 CAPSULE BY MOUTH 3 TIMES A DAY WITH MEALS FOR ACUTE FLARE UPS. 90 capsule 0  . irbesartan (AVAPRO) 300 MG tablet TAKE ONE TABLET AT BEDTIME. 30 tablet 9  . Multiple Vitamin (MULTIVITAMIN) tablet Take 1 tablet by mouth daily.    Marland Kitchen. terbinafine (LAMISIL) 250 MG tablet Take 1 tablet (250 mg total) by mouth daily. 30 tablet 0  . terbinafine (LAMISIL) 250  MG tablet Take 1 tablet (250 mg total) by mouth daily. 90 tablet 0   No current facility-administered medications on file prior to visit.   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  Respiratory: Negative for choking and stridor.   Gastrointestinal: Negative for vomiting or other signifcant bowel change Genitourinary: Negative for hematuria or decreased urine volume.  Musculoskeletal: Negative for other MSK pain or swelling Skin: Negative for color change and worsening wound.  Neurological: Negative for tremors and numbness other than noted  Psychiatric/Behavioral: Negative for decreased concentration or agitation other than above       Objective:   Physical Exam BP 132/88 mmHg  Pulse 100  Temp(Src) 98.6 F (37 C) (Oral)  Resp 20  Ht 5\' 4"  (1.626 m)  Wt 242 lb (109.77 kg)  BMI 41.52 kg/m2  SpO2 97% VS noted,  Constitutional: Pt appears well-developed, well-nourished.  HENT: Head: NCAT.  Right Ear: External ear normal.  Left Ear: External ear normal.  Eyes: . Pupils are equal, round, and reactive to light. Conjunctivae and EOM are normal Neck: Normal range of motion. Neck supple.  Cardiovascular: Normal rate and regular rhythm.   Pulmonary/Chest: Effort normal and breath sounds without rales or wheezing.  Abd:  Soft,  NT, ND, + BS Neurological: Pt is alert. Not confused , motor grossly intact Skin: Skin is warm. No rash Psychiatric: Pt behavior is normal. No agitation.    Wt Readings from Last 3 Encounters:  07/05/14 242 lb (109.77 kg)  01/16/14 221 lb 6 oz (100.415 kg)  10/23/13 210 lb (95.255 kg)       Assessment & Plan:

## 2014-07-05 NOTE — Assessment & Plan Note (Signed)
stable overall by history and exam, recent data reviewed with pt, and pt to continue medical treatment as before,  to f/u any worsening symptoms or concerns Lab Results  Component Value Date   HGBA1C 5.9 01/16/2014   For f/u lab next visit, work on wt loss, declines labs today

## 2014-07-05 NOTE — Assessment & Plan Note (Signed)
stable overall by history and exam, recent data reviewed with pt, and pt to continue medical treatment as before,  to f/u any worsening symptoms or concerns BP Readings from Last 3 Encounters:  07/05/14 132/88  01/16/14 122/86  11/29/13 176/96  OK for letter

## 2014-07-05 NOTE — Progress Notes (Signed)
Pre visit review using our clinic review tool, if applicable. No additional management support is needed unless otherwise documented below in the visit note. 

## 2014-07-05 NOTE — Patient Instructions (Signed)
Please continue all other medications as before, and refills have been done if requested.  Please have the pharmacy call with any other refills you may need.  Please continue your efforts at being more active, low cholesterol diet, and weight control..  Please keep your appointments with your specialists as you may have planned  Please return in 6 months, or sooner if needed, with Lab testing done 3-5 days before  

## 2014-07-19 ENCOUNTER — Other Ambulatory Visit: Payer: Self-pay | Admitting: Internal Medicine

## 2014-08-13 ENCOUNTER — Encounter: Payer: Self-pay | Admitting: Podiatry

## 2014-08-13 ENCOUNTER — Ambulatory Visit (INDEPENDENT_AMBULATORY_CARE_PROVIDER_SITE_OTHER): Payer: 59 | Admitting: Podiatry

## 2014-08-13 VITALS — BP 134/88 | HR 108 | Resp 16

## 2014-08-13 DIAGNOSIS — B351 Tinea unguium: Secondary | ICD-10-CM

## 2014-08-13 DIAGNOSIS — Z79899 Other long term (current) drug therapy: Secondary | ICD-10-CM | POA: Diagnosis not present

## 2014-08-13 MED ORDER — TERBINAFINE HCL 250 MG PO TABS: 250.0000 mg | ORAL_TABLET | Freq: Every day | ORAL | Status: DC

## 2014-08-13 NOTE — Progress Notes (Signed)
He presents today after only 30 days of Lamisil therapy. He states that he never actually took the 90 days of Lamisil therapy. He states the toenails seem to be growing out though not well yet.  Objective: Vital signs are stable he is alert and oriented 3. Pulses are palpable. Nail plates appear to be growing out proximally 75% clear. Otherwise nails remain thick distally discolored with onychomycosis.  Assessment: Long-term therapy with Lamisil due to onychomycosis. Pain in limb secondary to onychomycosis.  Plan: Debrided nails for him today and also wrote a prescription for Lamisil 250 mg tablets 1 by mouth daily 90 days and follow up with me in 4 months.

## 2014-10-15 ENCOUNTER — Other Ambulatory Visit: Payer: Self-pay | Admitting: Internal Medicine

## 2014-11-14 ENCOUNTER — Encounter: Payer: Self-pay | Admitting: Gastroenterology

## 2014-12-17 ENCOUNTER — Encounter: Payer: Self-pay | Admitting: Podiatry

## 2014-12-17 ENCOUNTER — Ambulatory Visit (INDEPENDENT_AMBULATORY_CARE_PROVIDER_SITE_OTHER): Payer: 59 | Admitting: Podiatry

## 2014-12-17 VITALS — BP 149/96 | HR 109 | Resp 16

## 2014-12-17 DIAGNOSIS — B351 Tinea unguium: Secondary | ICD-10-CM

## 2014-12-17 NOTE — Progress Notes (Signed)
Peter Rivera presents today for follow-up of his Lamisil therapy. He states that he has finished proximally 7 months of Lamisil and his toenails look great. At this point he states he is doing much better and still has approximately 30 days of Lamisil left to take. He denies fever chills nausea vomiting muscle aches pains itching rashing.  Objective: Vital signs are stable alert and oriented 3. Pulses are strongly palpable bilateral. He has a good affect about him today. Capillary fill time to digits 1 through 5 is immediate. Neurologic sensorium is intact percent twice a monofilament. Deep tendon reflexes are intact bilateral. Muscle strength +5 over 5 dorsiflexion plantar flexors and inverters and everters all digits of musculature is intact. Orthopedic evaluation of his x-rays all joints distal to the ankle for range of motion without crepitation. Cutaneous evaluation does demonstrate approximately 95% improvement of his onychomycosis.  Assessment: 34 year old black male with near 100% resolution of onychomycosis secondary to Lamisil therapy.  Plan: Discussed etiology pathology conservative versus surgical therapies. At this point I am requesting that he take his last month of Lamisil 1 tablet every other day and I will follow-up with him in 3 months if necessary.

## 2015-01-08 ENCOUNTER — Other Ambulatory Visit (INDEPENDENT_AMBULATORY_CARE_PROVIDER_SITE_OTHER): Payer: BLUE CROSS/BLUE SHIELD

## 2015-01-08 ENCOUNTER — Ambulatory Visit (INDEPENDENT_AMBULATORY_CARE_PROVIDER_SITE_OTHER): Payer: 59 | Admitting: Internal Medicine

## 2015-01-08 ENCOUNTER — Encounter: Payer: Self-pay | Admitting: Internal Medicine

## 2015-01-08 VITALS — BP 136/88 | HR 102 | Temp 97.8°F | Ht 64.0 in | Wt 228.0 lb

## 2015-01-08 DIAGNOSIS — R7302 Impaired glucose tolerance (oral): Secondary | ICD-10-CM | POA: Diagnosis not present

## 2015-01-08 DIAGNOSIS — J309 Allergic rhinitis, unspecified: Secondary | ICD-10-CM

## 2015-01-08 DIAGNOSIS — R109 Unspecified abdominal pain: Secondary | ICD-10-CM | POA: Diagnosis not present

## 2015-01-08 DIAGNOSIS — Z23 Encounter for immunization: Secondary | ICD-10-CM | POA: Diagnosis not present

## 2015-01-08 DIAGNOSIS — Z0000 Encounter for general adult medical examination without abnormal findings: Secondary | ICD-10-CM

## 2015-01-08 DIAGNOSIS — I10 Essential (primary) hypertension: Secondary | ICD-10-CM | POA: Diagnosis not present

## 2015-01-08 LAB — CBC WITH DIFFERENTIAL/PLATELET
BASOS ABS: 0 10*3/uL (ref 0.0–0.1)
BASOS PCT: 0.3 % (ref 0.0–3.0)
EOS ABS: 0.1 10*3/uL (ref 0.0–0.7)
Eosinophils Relative: 1.2 % (ref 0.0–5.0)
HCT: 43.8 % (ref 39.0–52.0)
Hemoglobin: 14.5 g/dL (ref 13.0–17.0)
LYMPHS ABS: 1.9 10*3/uL (ref 0.7–4.0)
LYMPHS PCT: 21.2 % (ref 12.0–46.0)
MCHC: 33.2 g/dL (ref 30.0–36.0)
MCV: 76.6 fl — ABNORMAL LOW (ref 78.0–100.0)
MONO ABS: 0.5 10*3/uL (ref 0.1–1.0)
Monocytes Relative: 5.9 % (ref 3.0–12.0)
NEUTROS ABS: 6.2 10*3/uL (ref 1.4–7.7)
NEUTROS PCT: 71.4 % (ref 43.0–77.0)
PLATELETS: 317 10*3/uL (ref 150.0–400.0)
RBC: 5.71 Mil/uL (ref 4.22–5.81)
RDW: 14 % (ref 11.5–15.5)
WBC: 8.7 10*3/uL (ref 4.0–10.5)

## 2015-01-08 LAB — LIPID PANEL
Cholesterol: 158 mg/dL (ref 0–200)
HDL: 47 mg/dL (ref >39.00)
LDL Cholesterol: 81 mg/dL (ref 0–99)
NONHDL: 111.46
Total CHOL/HDL Ratio: 3
Triglycerides: 153 mg/dL — ABNORMAL HIGH (ref 0.0–149.0)
VLDL: 30.6 mg/dL (ref 0.0–40.0)

## 2015-01-08 LAB — TSH: TSH: 0.69 u[IU]/mL (ref 0.35–4.50)

## 2015-01-08 LAB — HEMOGLOBIN A1C: HEMOGLOBIN A1C: 5.7 % (ref 4.6–6.5)

## 2015-01-08 LAB — HEPATIC FUNCTION PANEL
ALK PHOS: 116 U/L (ref 39–117)
ALT: 24 U/L (ref 0–53)
AST: 23 U/L (ref 0–37)
Albumin: 4.1 g/dL (ref 3.5–5.2)
BILIRUBIN DIRECT: 0 mg/dL (ref 0.0–0.3)
TOTAL PROTEIN: 7.8 g/dL (ref 6.0–8.3)
Total Bilirubin: 0.3 mg/dL (ref 0.2–1.2)

## 2015-01-08 LAB — URINALYSIS, ROUTINE W REFLEX MICROSCOPIC
Bilirubin Urine: NEGATIVE
Hgb urine dipstick: NEGATIVE
KETONES UR: NEGATIVE
Leukocytes, UA: NEGATIVE
Nitrite: NEGATIVE
PH: 6 (ref 5.0–8.0)
URINE GLUCOSE: NEGATIVE
UROBILINOGEN UA: 0.2 (ref 0.0–1.0)

## 2015-01-08 LAB — BASIC METABOLIC PANEL
BUN: 19 mg/dL (ref 6–23)
CHLORIDE: 104 meq/L (ref 96–112)
CO2: 29 meq/L (ref 19–32)
CREATININE: 1.27 mg/dL (ref 0.40–1.50)
Calcium: 9.2 mg/dL (ref 8.4–10.5)
GFR: 83.12 mL/min (ref >60.00)
Glucose, Bld: 84 mg/dL (ref 70–99)
POTASSIUM: 4 meq/L (ref 3.5–5.1)
Sodium: 141 mEq/L (ref 135–145)

## 2015-01-08 LAB — H. PYLORI ANTIBODY, IGG: H PYLORI IGG: NEGATIVE

## 2015-01-08 NOTE — Progress Notes (Signed)
Subjective:    Patient ID: Peter Rivera, male    DOB: 10-15-1980, 34 y.o.   MRN: [id number]  HPI  Here for wellness and f/u;  Overall doing ok;  Pt denies Chest pain, worsening SOB, DOE, wheezing, orthopnea, PND, worsening LE edema, palpitations, dizziness or syncope.  Pt denies neurological change such as new headache, facial or extremity weakness.  Pt denies polydipsia, polyuria, or low sugar symptoms. Pt states overall good compliance with treatment and medications, good tolerability, and has been trying to follow appropriate diet.  Pt denies worsening depressive symptoms, suicidal ideation or panic. No fever, night sweats, wt loss, loss of appetite, or other constitutional symptoms.  Pt states good ability with ADL's, has low fall risk, home safety reviewed and adequate, no other significant changes in hearing or vision, and only occasionally active with exercise.  Does have several wks ongoing nasal allergy symptoms with clearish congestion, itch and sneezing, without fever, pain, ST, cough, swelling or wheezing, asks for allergy testing. Denies worsening reflux, dysphagia, n/v, bowel change or blood. But has also had recurring epigastric/mid abd pain sharp and dull assoc in past over years with occas n/v, but some periods of asymptomatic, most recently without wt loss attributable to this per pt, and  Pt denies fever, wt loss, night sweats, loss of appetite, or other constitutional symptoms\ Wt Readings from Last 3 Encounters:  01/08/15 228 lb (103.42 kg)  07/05/14 242 lb (109.77 kg)  01/16/14 221 lb 6 oz (100.415 kg)   Past Medical History  Diagnosis Date  . Hypertension   . HTN (hypertension) 09/22/2010  . Hypertriglyceridemia 12/29/2010  . Impaired glucose tolerance 03/29/2011  . Acute gouty arthropathy 08/16/2011   No past surgical history on file.  reports that he has never smoked. He does not have any smokeless tobacco history on file. He reports that he drinks alcohol. He reports  that he does not use illicit drugs. family history includes Diabetes in his other. No Known Allergies Current Outpatient Prescriptions on File Prior to Visit  Medication Sig Dispense Refill  . allopurinol (ZYLOPRIM) 100 MG tablet TAKE 1 TABLET ONCE DAILY. 30 tablet 11  . amLODipine (NORVASC) 5 MG tablet TAKE 1 TABLET ONCE DAILY. 30 tablet 11  . indomethacin (INDOCIN) 50 MG capsule TAKE 1 CAPSULE BY MOUTH 3 TIMES A DAY WITH MEALS FOR ACUTE FLARE UPS. 90 capsule 2  . irbesartan (AVAPRO) 300 MG tablet TAKE ONE TABLET AT BEDTIME. 30 tablet 9  . terbinafine (LAMISIL) 250 MG tablet Take 1 tablet (250 mg total) by mouth daily. 90 tablet 0  . Multiple Vitamin (MULTIVITAMIN) tablet Take 1 tablet by mouth daily.     No current facility-administered medications on file prior to visit.   Review of Systems Constitutional: Negative for increased diaphoresis, other activity, appetite or siginficant weight change other than noted HENT: Negative for worsening hearing loss, ear pain, facial swelling, mouth sores and neck stiffness.   Eyes: Negative for other worsening pain, redness or visual disturbance.  Respiratory: Negative for shortness of breath and wheezing  Cardiovascular: Negative for chest pain and palpitations.  Gastrointestinal: Negative for diarrhea, blood in stool, abdominal distention or other pain Genitourinary: Negative for hematuria, flank pain or change in urine volume.  Musculoskeletal: Negative for myalgias or other joint complaints.  Skin: Negative for color change and wound or drainage.  Neurological: Negative for syncope and numbness. other than noted Hematological: Negative for adenopathy. or other swelling Psychiatric/Behavioral: Negative for hallucinations, SI, self-injury, decreased concentration  or other worsening agitation.      Objective:   Physical Exam BP 136/88 mmHg  Pulse 102  Temp(Src) 97.8 F (36.6 C) (Oral)  Ht 5\' 4"  (1.626 m)  Wt 228 lb (103.42 kg)  BMI 39.12  kg/m2  SpO2 97% VS noted, not ill appearing Constitutional: Pt is oriented to person, place, and time. Appears well-developed and well-nourished, in no significant distress Head: Normocephalic and atraumatic.  Right Ear: External ear normal.  Left Ear: External ear normal.  Nose: Nose normal.  Mouth/Throat: Oropharynx is clear and moist.  Eyes: Conjunctivae and EOM are normal. Pupils are equal, round, and reactive to light.  Bilat tm's with mild erythema.  Max sinus areas non tender.  Pharynx with mild erythema, no exudate Neck: Normal range of motion. Neck supple. No JVD present. No tracheal deviation present or significant neck LA or mass Cardiovascular: Normal rate, regular rhythm, normal heart sounds and intact distal pulses.   Pulmonary/Chest: Effort normal and breath sounds without rales or wheezing  Abdominal: Soft. Bowel sounds are normal. NT. No HSM  Musculoskeletal: Normal range of motion. Exhibits no edema.  Lymphadenopathy:  Has no cervical adenopathy.  Neurological: Pt is alert and oriented to person, place, and time. Pt has normal reflexes. No cranial nerve deficit. Motor grossly intact Skin: Skin is warm and dry. No rash noted.  Psychiatric:  Has normal mood and affect. Behavior is normal.     Assessment & Plan:

## 2015-01-08 NOTE — Assessment & Plan Note (Signed)
stable overall by history and exam, recent data reviewed with pt, and pt to continue medical treatment as before,  to f/u any worsening symptoms or concerns BP Readings from Last 3 Encounters:  01/08/15 136/88  12/17/14 149/96  08/13/14 134/88

## 2015-01-08 NOTE — Assessment & Plan Note (Signed)

## 2015-01-08 NOTE — Patient Instructions (Addendum)

## 2015-01-08 NOTE — Progress Notes (Signed)
Pre visit review using our clinic review tool, if applicable. No additional management support is needed unless otherwise documented below in the visit note. 

## 2015-01-08 NOTE — Addendum Note (Signed)
Addended by: Corwin Levins on: 01/08/2015 09:22 AM   Modules accepted: Orders, SmartSet

## 2015-01-08 NOTE — Assessment & Plan Note (Signed)
?   Recurring gastritis, exam benign today, pt has f/u with GI oct 5 appt, for h pylori serology today, TUMS prn,  to f/u any worsening symptoms or concerns

## 2015-01-08 NOTE — Assessment & Plan Note (Signed)
For otc zyrtec prn, also for allergy serologies today as documented,  to f/u any worsening symptoms or concerns

## 2015-01-08 NOTE — Assessment & Plan Note (Signed)
Asympt,  to f/u any worsening symptoms or concerns, cont wt loss efforts

## 2015-01-09 LAB — ~~LOC~~ ALLERGY PANEL
ALLERGEN, CEDAR TREE, T6: 0.91 kU/L — AB
ALLERGEN, D PTERNOYSSINUS, D1: 0.34 kU/L — AB
ALTERNARIA ALTERNATA: 0.98 kU/L — AB
Allergen, Comm Silver Birch, t9: 4.11 kU/L — ABNORMAL HIGH
Allergen, Mulberry, t76: <0.1 kU/L
Aspergillus fumigatus, m3: 0.89 kU/L — ABNORMAL HIGH
BERMUDA GRASS: 14 kU/L — AB
Bahia Grass: 24.5 kU/L — ABNORMAL HIGH
Box Elder IgE: 1.32 kU/L — ABNORMAL HIGH
CAT DANDER: 0.12 kU/L — AB
CLADOSPORIUM HERBARUM: 0.74 kU/L — AB
Cockroach: <0.1 kU/L
Common Ragweed: 8.6 kU/L — ABNORMAL HIGH
D. FARINAE: 0.37 kU/L — AB
DOG DANDER: 0.13 kU/L — AB
ELM IGE: 2.57 kU/L — AB
Johnson Grass: 10.6 kU/L — ABNORMAL HIGH
Mucor Racemosus: <0.1 kU/L
Mugwort: 0.89 kU/L — ABNORMAL HIGH
NETTLE: 0.59 kU/L — AB
OAK CLASS: 5.58 kU/L — AB
PECAN/HICKORY TREE IGE: 3.41 kU/L — AB
Plantain: 1.57 kU/L — ABNORMAL HIGH
Rough Pigweed  IgE: 0.95 kU/L — ABNORMAL HIGH
SHEEP SORREL IGE: 1.2 kU/L — AB
Stemphylium Botryosum: 0.71 kU/L — ABNORMAL HIGH
Sweet Gum: 0.3 kU/L — ABNORMAL HIGH
Timothy Grass: 16.4 kU/L — ABNORMAL HIGH

## 2015-01-10 LAB — FOOD ALLERGY PROFILE
ALLERGEN CORN, IGE: 0.6 kU/L — AB
Clam IgE: <0.1 kU/L
Egg White IgE: <0.1 kU/L
Milk IgE: <0.1 kU/L
Peanut IgE: 9 kU/L — AB
SESAME SEED IGE: 1.92 kU/L — AB
SOYBEAN IGE: 0.86 kU/L — AB
WALNUT IGE: 0.13 kU/L — AB
Wheat IgE: 0.75 kU/L — AB

## 2015-01-11 ENCOUNTER — Other Ambulatory Visit: Payer: Self-pay | Admitting: Internal Medicine

## 2015-01-11 MED ORDER — EPINEPHRINE 0.3 MG/0.3ML IJ SOAJ: 0.3000 mg | Freq: Once | INTRAMUSCULAR | Status: DC

## 2015-01-21 ENCOUNTER — Ambulatory Visit: Payer: BC Managed Care – PPO | Admitting: Internal Medicine

## 2015-01-21 ENCOUNTER — Encounter: Payer: Self-pay | Admitting: Gastroenterology

## 2015-01-21 ENCOUNTER — Ambulatory Visit (INDEPENDENT_AMBULATORY_CARE_PROVIDER_SITE_OTHER): Payer: 59 | Admitting: Gastroenterology

## 2015-01-21 VITALS — BP 134/90 | HR 92 | Ht 64.0 in | Wt 231.0 lb

## 2015-01-21 DIAGNOSIS — R1084 Generalized abdominal pain: Secondary | ICD-10-CM | POA: Diagnosis not present

## 2015-01-21 NOTE — Progress Notes (Signed)
    History of Present Illness: This is a 34 year old male referred by Oliver Barre, MD for the evaluation of intermittent abdominal pain. Patient relates episodes of generalized abdominal pain that lasts for several days to weeks at a time. His symptoms began in high school. There may be 2-3 years in between symptoms. When they are active they are intermittent over several days to a few weeks and then dissipate. No association with food activity or bowel movements. He takes Indocin on a daily basis the past 2-3 years. Denies weight loss, constipation, diarrhea, change in stool caliber, melena, hematochezia, nausea, vomiting, dysphagia, reflux symptoms, chest pain.  Review of Systems: Pertinent positive and negative review of systems were noted in the above HPI section. All other review of systems were otherwise negative.  Current Medications, Allergies, Past Medical History, Past Surgical History, Family History and Social History were reviewed in [employer] record.  Physical Exam: General: Well developed, well nourished, no acute distress Head: Normocephalic and atraumatic Eyes:  sclerae anicteric, EOMI Ears: Normal auditory acuity Mouth: No deformity or lesions Neck: Supple, no masses or thyromegaly Lungs: Clear throughout to auscultation Heart: Regular rate and rhythm; no murmurs, rubs or bruits Abdomen: Soft, non tender and non distended. No masses, hepatosplenomegaly or hernias noted. Normal Bowel sounds Rectal: deferred Musculoskeletal: Symmetrical with no gross deformities  Skin: No lesions on visible extremities Pulses:  Normal pulses noted Extremities: No clubbing, cyanosis, edema or deformities noted Neurological: Alert oriented x 4, grossly nonfocal Cervical Nodes:  No significant cervical adenopathy Inguinal Nodes: No significant inguinal adenopathy Psychological:  Alert and cooperative. Normal mood and affect  Assessment and Recommendations:  1.  Intermittent, episodic generalized abdominal pain. Etiology unclear. Rule out cholelithiasis, gastritis, ulcer. He states he takes Indocin on a daily basis for gout however the medication appears to be written as needed during flares. I've asked him to confirm with Dr. Jonny Ruiz and to discontinue the regular use of Indocin and other NSAIDs unless otherwise recommended by a physician. Schedule abdominal ultrasound and stool Hemoccults. If no clear cause is uncovered will proceed with EGD   cc: Oliver Barre, MD

## 2015-01-21 NOTE — Patient Instructions (Signed)
You have been scheduled for an abdominal ultrasound at [facility] (1st floor of hospital) on 01-28-15 at 8:30am. Please arrive 15 minutes prior to your appointment for registration. Make certain not to have anything to eat or drink 6 hours prior to your appointment. Should you need to reschedule your appointment, please contact radiology at [phone]. This test typically takes about 30 minutes to perform.  Follow directions on hemocult cards and mail back to the office as directed.  Thank you for choosing me and [facility].  Venita Lick. Pleas Koch., MD., Clementeen Graham   CC: Oliver Barre, MD

## 2015-01-25 ENCOUNTER — Other Ambulatory Visit: Payer: Self-pay | Admitting: Podiatry

## 2015-01-27 ENCOUNTER — Telehealth: Payer: Self-pay | Admitting: *Deleted

## 2015-01-27 NOTE — Telephone Encounter (Signed)
I spoke with pt concerning the refill request of the Lamisil, informed that if he had completed 90 doses, that was the therapeutic amount, and no refill was necessary and pt states understanding.

## 2015-01-28 ENCOUNTER — Ambulatory Visit (HOSPITAL_COMMUNITY)
Admission: RE | Admit: 2015-01-28 | Discharge: 2015-01-28 | Disposition: A | Discharge status: Home / Self Care | Payer: BLUE CROSS/BLUE SHIELD | Source: Ambulatory Visit | Attending: Gastroenterology | Admitting: Gastroenterology

## 2015-01-28 DIAGNOSIS — R109 Unspecified abdominal pain: Secondary | ICD-10-CM | POA: Insufficient documentation

## 2015-01-28 DIAGNOSIS — I10 Essential (primary) hypertension: Secondary | ICD-10-CM | POA: Diagnosis not present

## 2015-01-28 DIAGNOSIS — R1084 Generalized abdominal pain: Secondary | ICD-10-CM

## 2015-01-28 DIAGNOSIS — K760 Fatty (change of) liver, not elsewhere classified: Secondary | ICD-10-CM | POA: Diagnosis not present

## 2015-03-07 ENCOUNTER — Encounter: Payer: Self-pay | Admitting: Podiatry

## 2015-03-07 ENCOUNTER — Ambulatory Visit (INDEPENDENT_AMBULATORY_CARE_PROVIDER_SITE_OTHER): Payer: BLUE CROSS/BLUE SHIELD

## 2015-03-07 ENCOUNTER — Ambulatory Visit (INDEPENDENT_AMBULATORY_CARE_PROVIDER_SITE_OTHER): Payer: BLUE CROSS/BLUE SHIELD | Admitting: Podiatry

## 2015-03-07 VITALS — BP 135/90 | HR 90 | Resp 18

## 2015-03-07 DIAGNOSIS — R52 Pain, unspecified: Secondary | ICD-10-CM

## 2015-03-07 DIAGNOSIS — M722 Plantar fascial fibromatosis: Secondary | ICD-10-CM

## 2015-03-07 MED ORDER — DICLOFENAC SODIUM 75 MG PO TBEC: 75.0000 mg | DELAYED_RELEASE_TABLET | Freq: Two times a day (BID) | ORAL | Status: DC

## 2015-03-07 NOTE — Patient Instructions (Signed)

## 2015-03-10 ENCOUNTER — Encounter: Payer: Self-pay | Admitting: Podiatry

## 2015-03-10 NOTE — Progress Notes (Signed)
Patient ID: Peter Rivera, male   DOB: 11/13/1980, 34 y.o.   MRN: [id number]  Subjective: 34 year old male presents the office with concerns of bilateral heel pain which has been ongoing for approximately 2 weeks. He states he jumped over a low-lying since and since then he said pain to his heels. He discusses a throbbing sensation. He has pain mostly in the morning when he first gets up or after periods of rest. Once he starts walking the pain to subside. He does not have pain throughout the day. Denies any swelling or bruising. He denies any numbness or tingling. No redness of one area. No tingling or numbness. He's had no recent treatment. No other complaints at this time.  Objective: General: AAO x3, NAD  Dermatological: Skin is warm, dry and supple bilateral. Nails x 10 are well manicured; remaining integument appears unremarkable at this time. There are no open sores, no preulcerative lesions, no rash or signs of infection present.  Vascular: Dorsalis Pedis artery and Posterior Tibial artery pedal pulses are 2/4 bilateral with immedate capillary fill time. Pedal hair growth present. No varicosities and no lower extremity edema present bilateral. There is no pain with calf compression, swelling, warmth, erythema.   Neruologic: Grossly intact via light touch bilateral. Vibratory intact via tuning fork bilateral. Protective threshold with Semmes Wienstein monofilament intact to all pedal sites bilateral. Patellar and Achilles deep tendon reflexes 2+ bilateral. No Babinski or clonus noted bilateral.   Musculoskeletal: No gross boney pedal deformities bilateral. There is tenderness along the plantar medial tubercle of the calcaneus at insertion of the plantar fascial bilaterally. There is no pain with course of plantar fascial in the arch of the foot. There is no pain with lateral compression of the calcaneus or pain the vibratory sensation of plantar calcaneus. There is no pain along the course last  insertion of the Achilles tendon. No defect is noted. Thompson test is negative. There is mild discomfort on the fifth metatarsal base and the left foot. No other areas of tenderness to bilateral lower extremities. No pain, crepitus, or limitation noted with foot and ankle range of motion bilateral. Muscular strength 5/5 in all groups tested bilateral.  Gait: Unassisted, Nonantalgic.   Assessment: 34 year old male with bilateral heel pain, likely plantar fasciitis; questionable fracture left fifth metatarsal base  Plan: -X-rays were obtained and reviewed with the patient. There is no definitive evidence of acute fracture or stress fracture. Along the left tuberosity the fifth metatarsal was questionable changes on the left side compared to the contralateral extremity. I discussed immobilization with him however he wishes to hold off for now. There does not appear to be any definitive fracture stress fracture of the calcaneus bilaterally.  -Treatment options discussed including all alternatives, risks, and complications -Patient elects to proceed with steroid injection into the right heel. Under sterile skin preparation, a total of 2.5cc of kenalog 10, 0.5% Marcaine plain, and 2% lidocaine plain were infiltrated into the symptomatic area without complication. A band-aid was applied. Patient tolerated the injection well without complication. Post-injection care with discussed with the patient. Discussed with the patient to ice the area over the next couple of days to help prevent a steroid flare.  -Plantar fascial braces were dispensed -Ice and elevation -Stretching exercises -Discussed shoe gear modifications -Follow-up in 2 weeks or sooner if any problems arise. In the meantime, encouraged to call the office with any questions, concerns, change in symptoms.   *xray left foot next appointment.   Ovid Curd,  DPM

## 2015-03-25 ENCOUNTER — Ambulatory Visit (INDEPENDENT_AMBULATORY_CARE_PROVIDER_SITE_OTHER): Payer: BLUE CROSS/BLUE SHIELD | Admitting: Podiatry

## 2015-03-25 ENCOUNTER — Encounter: Payer: Self-pay | Admitting: Podiatry

## 2015-03-25 ENCOUNTER — Ambulatory Visit (INDEPENDENT_AMBULATORY_CARE_PROVIDER_SITE_OTHER): Payer: BLUE CROSS/BLUE SHIELD

## 2015-03-25 VITALS — BP 163/99 | HR 116 | Resp 18

## 2015-03-25 DIAGNOSIS — M722 Plantar fascial fibromatosis: Secondary | ICD-10-CM

## 2015-03-25 DIAGNOSIS — R52 Pain, unspecified: Secondary | ICD-10-CM

## 2015-03-31 ENCOUNTER — Encounter: Payer: Self-pay | Admitting: Podiatry

## 2015-03-31 ENCOUNTER — Encounter: Payer: BLUE CROSS/BLUE SHIELD | Admitting: Gastroenterology

## 2015-03-31 DIAGNOSIS — M722 Plantar fascial fibromatosis: Secondary | ICD-10-CM | POA: Insufficient documentation

## 2015-03-31 NOTE — Progress Notes (Signed)
Patient ID: Peter Rivera, male   DOB: 05/31/1980, 34 y.o.   MRN: [id number]  Subjective: Peter Rivera presents to the office today for follow-up evaluation of bilateral heel pain and left 5th metatarsal base pain. They state that they are doing much better and he is not having any pain at this time to either of his feet. They have been icing, stretching, try to wear supportive shoe as much as possible.  No other complaints at this time. No acute changes since last appointment. They deny any systemic complaints such as fevers, chills, nausea, vomiting.  Objective: General: AAO x3, NAD  Dermatological: Skin is warm, dry and supple bilateral. Nails x 10 are well manicured; remaining integument appears unremarkable at this time. There are no open sores, no preulcerative lesions, no rash or signs of infection present.  Vascular: Dorsalis Pedis artery and Posterior Tibial artery pedal pulses are 2/4 bilateral with immedate capillary fill time. Pedal hair growth present. There is no pain with calf compression, swelling, warmth, erythema.   Neruologic: Grossly intact via light touch bilateral. Vibratory intact via tuning fork bilateral. Protective threshold with Semmes Wienstein monofilament intact to all pedal sites bilateral.   Musculoskeletal: There is no significant tenderness to palpation along the plantar medial tubercle of the calcaneus at the insertion of the plantar fascia on the left and right foot. There is  no pain along the course of the plantar fascia within the arch of the foot. Plantar fascia appears to be intact bilaterally. There is no pain with lateral compression of the calcaneus and there is no pain with vibratory sensation. There is no pain along the course or insertion of the Achilles tendon. There is no pain to the left fifth metatarsal base at this time. There is a palmar course the peroneal tendons. There are no other areas of tenderness to bilateral lower extremities. No gross  boney pedal deformities bilateral. No pain, crepitus, or limitation noted with foot and ankle range of motion bilateral. Muscular strength 5/5 in all groups tested bilateral.  Gait: Unassisted, Nonantalgic.   Assessment: Presents for follow-up evaluation for heel pain, likely plantar fasciitis and left 5th metatarsal base pain; currently not having any symptoms.   Plan: -X-rays were obtained and reviewed with the patient. There is no definitive evidence of acute fracture or stress fracture identified at this time. -Treatment options discussed including all alternatives, risks, and complications -Recommended continue supportive shoe gear. I also discussed with him orthotics to help prevent recurrence and to help support his foot given his foot type. [facility]  -Ice and stretching exercises on a daily basis. -Continue supportive shoe gear. -Follow-up in 3 weeks to [facility] or sooner if any problems arise. In the meantime, encouraged to call the office with any questions, concerns, change in symptoms.   Ovid CurdMatthew Wagoner, DPM

## 2015-04-15 ENCOUNTER — Ambulatory Visit: Payer: BLUE CROSS/BLUE SHIELD | Admitting: *Deleted

## 2015-04-15 DIAGNOSIS — M722 Plantar fascial fibromatosis: Secondary | ICD-10-CM

## 2015-04-15 NOTE — Patient Instructions (Signed)

## 2015-04-15 NOTE — Progress Notes (Signed)
Patient ID: Peter RegulusJason Solly, male   DOB: 05/21/1980, 34 y.o.   MRN: [id number] Patient presents for orthotic pick up.  Verbal and written break in and wear instructions given.  Patient will follow up in 4 weeks if symptoms worsen or fail to improve.

## 2015-05-02 ENCOUNTER — Other Ambulatory Visit: Payer: Self-pay | Admitting: Internal Medicine

## 2015-07-30 ENCOUNTER — Other Ambulatory Visit: Payer: Self-pay | Admitting: Internal Medicine

## 2015-09-05 ENCOUNTER — Other Ambulatory Visit: Payer: Self-pay | Admitting: Internal Medicine

## 2015-11-04 ENCOUNTER — Other Ambulatory Visit: Payer: Self-pay | Admitting: Internal Medicine

## 2015-12-04 ENCOUNTER — Other Ambulatory Visit: Payer: Self-pay | Admitting: Internal Medicine

## 2015-12-31 ENCOUNTER — Other Ambulatory Visit: Payer: Self-pay | Admitting: Internal Medicine

## 2016-01-02 ENCOUNTER — Other Ambulatory Visit: Payer: Self-pay | Admitting: Internal Medicine

## 2016-01-12 ENCOUNTER — Telehealth: Payer: Self-pay | Admitting: Internal Medicine

## 2016-01-12 NOTE — Telephone Encounter (Signed)
Ok with me 

## 2016-01-12 NOTE — Telephone Encounter (Signed)
Ok with me, if ok with patient

## 2016-01-12 NOTE — Telephone Encounter (Signed)
Patient needs CPE.  Patient of Dr. Jonny RuizJohn.  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  This of course is the days Dr. Jonny RuizJohn is not in the office.  Is there anyway Tammy SoursGreg could do the CPE?

## 2016-01-13 ENCOUNTER — Encounter: Payer: BLUE CROSS/BLUE SHIELD | Admitting: Internal Medicine

## 2016-01-13 NOTE — Telephone Encounter (Signed)
Got scheduled  °

## 2016-01-14 ENCOUNTER — Encounter: Payer: BLUE CROSS/BLUE SHIELD | Admitting: Internal Medicine

## 2016-01-19 ENCOUNTER — Ambulatory Visit (INDEPENDENT_AMBULATORY_CARE_PROVIDER_SITE_OTHER): Payer: BLUE CROSS/BLUE SHIELD | Admitting: Family

## 2016-01-19 ENCOUNTER — Encounter: Payer: Self-pay | Admitting: Family

## 2016-01-19 VITALS — BP 130/90 | HR 101 | Temp 98.0°F | Resp 18 | Ht 64.0 in | Wt 252.0 lb

## 2016-01-19 DIAGNOSIS — Z0000 Encounter for general adult medical examination without abnormal findings: Secondary | ICD-10-CM

## 2016-01-19 DIAGNOSIS — Z23 Encounter for immunization: Secondary | ICD-10-CM

## 2016-01-19 MED ORDER — SILDENAFIL CITRATE 100 MG PO TABS: 50.0000 mg | ORAL_TABLET | Freq: Every day | ORAL | 2 refills | Status: DC | PRN

## 2016-01-19 NOTE — Assessment & Plan Note (Signed)
1) Anticipatory Guidance: Discussed importance of wearing a seatbelt while driving and not texting while driving; changing batteries in smoke detector at least once annually; wearing suntan lotion when outside; eating a balanced and moderate diet; getting physical activity at least 30 minutes per day.  2) Immunizations / Screenings / Labs:  Influenza updated today. All other immunizations are up-to-date per recommendations. Obtain A1c for diabetes screening. All other screenings are up-to-date per recommendations. Obtain CBC, CMET, TSH and lipid profile.    Overall well exam with risk factors for cardiovascular disease including hypertension and obesity. Hypertension appears adequately controlled a goal with current medication regimen. Recommended weight loss of 5-10% of current body weight through nutrition and physical activity changes. Does note some decreased libido. Start sildenafil. Continue other healthy lifestyle behaviors and choices. Follow-up prevention exam in 1 year. Follow-up office visit pending blood work.

## 2016-01-19 NOTE — Progress Notes (Signed)
Subjective:    Patient ID: Peter Rivera, male    DOB: 1981-02-09, 35 y.o.   MRN: [id number]  Chief Complaint  Patient presents with  . CPE    not fasting    HPI:  Peter Rivera is a 35 y.o. male who presents today for an annual wellness visit.   1) Health Maintenance -   Diet - Averages about 1-3 on average that consists of a regular diet. Caffeine 1-2 cups per day; 90% of diet is fast and processed foods  Exercise - No structured exercise; occasional at best   2) Preventative Exams / Immunizations:  Dental -- Up to date  Vision -- Up to date   Health Maintenance  Topic Date Due  . HIV Screening  04/10/1995  . INFLUENZA VACCINE  12/02/2015  . TETANUS/TDAP  09/21/2020    Immunization History  Administered Date(s) Administered  . Influenza,inj,Quad PF,36+ Mos 04/03/2013, 01/16/2014, 01/08/2015, 01/19/2016  . Tdap 09/22/2010     No Known Allergies   Outpatient Medications Prior to Visit  Medication Sig Dispense Refill  . allopurinol (ZYLOPRIM) 100 MG tablet TAKE ONE TABLET BY MOUTH DAILY 30 tablet 2  . amLODipine (NORVASC) 5 MG tablet TAKE ONE TABLET BY MOUTH EACH DAY 30 tablet 2  . EPINEPHrine (EPIPEN 2-PAK) 0.3 mg/0.3 mL IJ SOAJ injection Inject 0.3 mLs (0.3 mg total) into the muscle once. 2 Device 1  . irbesartan (AVAPRO) 300 MG tablet TAKE ONE TABLET BY MOUTH AT BEDTIME 30 tablet 0  . indomethacin (INDOCIN) 50 MG capsule TAKE 1 CAPSULE BY MOUTH 3 TIMES A DAY WITH MEALS FOR ACUTE FLARE UPS. 90 capsule 2  . diclofenac (VOLTAREN) 75 MG EC tablet Take 1 tablet (75 mg total) by mouth 2 (two) times daily. 30 tablet 2  . Multiple Vitamin (MULTIVITAMIN) tablet Take 1 tablet by mouth daily.    Marland Kitchen terbinafine (LAMISIL) 250 MG tablet Take 1 tablet (250 mg total) by mouth daily. 90 tablet 0   No facility-administered medications prior to visit.      Past Medical History:  Diagnosis Date  . 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 HTN (hypertension) 09/22/2010  .  Hypertension   . Hypertriglyceridemia 12/29/2010  . Impaired glucose tolerance 03/29/2011     History reviewed. No pertinent surgical history.   Family History  Problem Relation Age of Onset  . Diabetes Paternal Grandmother   . Healthy Mother   . Healthy Father   . Diabetes Maternal Grandfather      Social History   Social History  . Marital status: Married    Spouse name: N/A  . Number of children: 1  . Years of education: 86   Occupational History  . truck driver    Social History Main Topics  . Smoking status: Never Smoker  . Smokeless tobacco: Never Used  . Alcohol use 0.0 oz/week     Comment: occasional  . Drug use: No  . Sexual activity: Not on file   Other Topics Concern  . Not on file   Social History Narrative  . No narrative on file    Review of Systems  Constitutional: Denies fever, chills, fatigue, or significant weight gain/loss. HENT: Head: Denies headache or neck pain Ears: Denies changes in hearing, ringing in ears, earache, drainage Nose: Denies discharge, stuffiness, itching, nosebleed, sinus pain Throat: Denies sore throat, hoarseness, dry mouth, sores, thrush Eyes: Denies loss/changes in vision, pain, redness, blurry/double vision, flashing lights Cardiovascular: Denies chest pain/discomfort, tightness, palpitations, shortness of  breath with activity, difficulty lying down, swelling, sudden awakening with shortness of breath Respiratory: Denies shortness of breath, cough, sputum production, wheezing Gastrointestinal: Denies dysphasia, heartburn, change in appetite, nausea, change in bowel habits, rectal bleeding, constipation, diarrhea, yellow skin or eyes Genitourinary: Denies frequency, urgency, burning/pain, blood in urine, incontinence, change in urinary strength. Musculoskeletal: Denies muscle/joint pain, stiffness, back pain, redness or swelling of joints, trauma Skin: Denies rashes, lumps, itching, dryness, color changes, or hair/nail  changes Neurological: Denies dizziness, fainting, seizures, weakness, numbness, tingling, tremor Psychiatric - Denies nervousness, stress, depression or memory loss Endocrine: Denies heat or cold intolerance, sweating, frequent urination, excessive thirst, changes in appetite Hematologic: Denies ease of bruising or bleeding     Objective:     BP 130/90 (BP Location: Left Arm, Patient Position: Sitting, Cuff Size: Large)   Pulse (!) 101   Temp 98 F (36.7 C) (Oral)   Resp 18   Ht 5\' 4"  (1.626 m)   Wt 252 lb (114.3 kg)   SpO2 96%   BMI 43.26 kg/m  Nursing note and vital signs reviewed.  Physical Exam  Constitutional: He is oriented to person, place, and time. He appears well-developed and well-nourished.  HENT:  Head: Normocephalic.  Right Ear: Hearing, tympanic membrane, external ear and ear canal normal.  Left Ear: Hearing, tympanic membrane, external ear and ear canal normal.  Nose: Nose normal.  Mouth/Throat: Uvula is midline, oropharynx is clear and moist and mucous membranes are normal.  Eyes: Conjunctivae and EOM are normal. Pupils are equal, round, and reactive to light.  Neck: Neck supple. No JVD present. No tracheal deviation present. No thyromegaly present.  Cardiovascular: Normal rate, regular rhythm, normal heart sounds and intact distal pulses.   Pulmonary/Chest: Effort normal and breath sounds normal.  Abdominal: Soft. Bowel sounds are normal. He exhibits no distension and no mass. There is no tenderness. There is no rebound and no guarding.  Musculoskeletal: Normal range of motion. He exhibits no edema or tenderness.  Lymphadenopathy:    He has no cervical adenopathy.  Neurological: He is alert and oriented to person, place, and time. He has normal reflexes. No cranial nerve deficit. He exhibits normal muscle tone. Coordination normal.  Skin: Skin is warm and dry.  Psychiatric: He has a normal mood and affect. His behavior is normal. Judgment and thought content  normal.       Assessment & Plan:   Problem List Items Addressed This Visit      Other   Preventative health care - Primary    1) Anticipatory Guidance: Discussed importance of wearing a seatbelt while driving and not texting while driving; changing batteries in smoke detector at least once annually; wearing suntan lotion when outside; eating a balanced and moderate diet; getting physical activity at least 30 minutes per day.  2) Immunizations / Screenings / Labs:  Influenza updated today. All other immunizations are up-to-date per recommendations. Obtain A1c for diabetes screening. All other screenings are up-to-date per recommendations. Obtain CBC, CMET, TSH and lipid profile.    Overall well exam with risk factors for cardiovascular disease including hypertension and obesity. Hypertension appears adequately controlled a goal with current medication regimen. Recommended weight loss of 5-10% of current body weight through nutrition and physical activity changes. Does note some decreased libido. Start sildenafil. Continue other healthy lifestyle behaviors and choices. Follow-up prevention exam in 1 year. Follow-up office visit pending blood work.       Relevant Orders   CBC   Comprehensive  metabolic panel   Lipid panel   TSH   Hemoglobin A1c   Obesity, morbid (HCC)    BMI 43.26. Recommend weight loss of 5-10% of current body weight. Recommend increasing physical activity to 30 minutes of moderate level activity daily. Encourage nutritional intake that focuses on nutrient dense foods and is moderate, varied, and balanced and is low in saturated fats and processed/sugary foods. Continue to monitor.        Other Visit Diagnoses    Encounter for immunization       Relevant Orders   Flu Vaccine QUAD 36+ mos IM (Completed)   CBC   Comprehensive metabolic panel   Lipid panel   TSH       I have discontinued Mr. Trovato's multivitamin, terbinafine, indomethacin, and diclofenac. I  am also having him start on sildenafil. Additionally, I am having him maintain his EPINEPHrine, irbesartan, amLODipine, and allopurinol.   Meds ordered this encounter  Medications  . sildenafil (VIAGRA) 100 MG tablet    Sig: Take 0.5-1 tablets (50-100 mg total) by mouth daily as needed for erectile dysfunction.    Dispense:  10 tablet    Refill:  2    Order Specific Question:   Supervising Provider    Answer:   Hillard DankerRAWFORD, ELIZABETH A [[id number]]     Follow-up: Return in about 6 months (around 07/18/2016), or if symptoms worsen or fail to improve.   Jeanine Luzalone, Randen Kauth, FNP

## 2016-01-19 NOTE — Patient Instructions (Signed)
Thank you for choosing [facility].  SUMMARY AND INSTRUCTIONS:  Medication:  Please continue to take your medications as prescribed.   Labs:  Please stop by the lab on the lower level of the building for your blood work. Your results will be released to MyChart (or called to you) after review, usually within 72 hours after test completion. If any changes need to be made, you will be notified at that same time.  1.) The lab is open from 7:30am to 5:30 pm Monday-Friday 2.) No appointment is necessary 3.) Fasting (if needed) is 6-8 hours after food and drink; black coffee and water are okay   Follow up:  If your symptoms worsen or fail to improve, please contact our office for further instruction, or in case of emergency go directly to the emergency room at the closest medical facility.   Health Maintenance, Male A healthy lifestyle and preventative care can promote health and wellness.  Maintain regular health, dental, and eye exams.  Eat a healthy diet. Foods like vegetables, fruits, whole grains, low-fat dairy products, and lean protein foods contain the nutrients you need and are low in calories. Decrease your intake of foods high in solid fats, added sugars, and salt. Get information about a proper diet from your health care provider, if necessary.  Regular physical exercise is one of the most important things you can do for your health. Most adults should get at least 150 minutes of moderate-intensity exercise (any activity that increases your heart rate and causes you to sweat) each week. In addition, most adults need muscle-strengthening exercises on 2 or more days a week.   Maintain a healthy weight. The body mass index (BMI) is a screening tool to identify possible weight problems. It provides an estimate of body fat based on height and weight. Your health care provider can find your BMI and can help you achieve or maintain a healthy weight. For males 20 years and  older:  A BMI below 18.5 is considered underweight.  A BMI of 18.5 to 24.9 is normal.  A BMI of 25 to 29.9 is considered overweight.  A BMI of 30 and above is considered obese.  Maintain normal blood lipids and cholesterol by exercising and minimizing your intake of saturated fat. Eat a balanced diet with plenty of fruits and vegetables. Blood tests for lipids and cholesterol should begin at age 20 and be repeated every 5 years. If your lipid or cholesterol levels are high, you are over age 50, or you are at high risk for heart disease, you may need your cholesterol levels checked more frequently.Ongoing high lipid and cholesterol levels should be treated with medicines if diet and exercise are not working.  If you smoke, find out from your health care provider how to quit. If you do not use tobacco, do not start.  Lung cancer screening is recommended for adults aged 55-80 years who are at high risk for developing lung cancer because of a history of smoking. A yearly low-dose CT scan of the lungs is recommended for people who have at least a 30-pack-year history of smoking and are current smokers or have quit within the past 15 years. A pack year of smoking is smoking an average of 1 pack of cigarettes a day for 1 year (for example, a 30-pack-year history of smoking could mean smoking 1 pack a day for 30 years or 2 packs a day for 15 years). Yearly screening should continue until the smoker has stopped   smoking for at least 15 years. Yearly screening should be stopped for people who develop a health problem that would prevent them from having lung cancer treatment.  If you choose to drink alcohol, do not have more than 2 drinks per day. One drink is considered to be 12 oz (360 mL) of beer, 5 oz (150 mL) of wine, or 1.5 oz (45 mL) of liquor.  Avoid the use of street drugs. Do not share needles with anyone. Ask for help if you need support or instructions about stopping the use of drugs.  High  blood pressure causes heart disease and increases the risk of stroke. High blood pressure is more likely to develop in:  People who have blood pressure in the end of the normal range (100-139/85-89 mm Hg).  People who are overweight or obese.  People who are African American.  If you are 18-39 years of age, have your blood pressure checked every 3-5 years. If you are 40 years of age or older, have your blood pressure checked every year. You should have your blood pressure measured twice--once when you are at a hospital or clinic, and once when you are not at a hospital or clinic. Record the average of the two measurements. To check your blood pressure when you are not at a hospital or clinic, you can use:  An automated blood pressure machine at a pharmacy.  A home blood pressure monitor.  If you are 45-79 years old, ask your health care provider if you should take aspirin to prevent heart disease.  Diabetes screening involves taking a blood sample to check your fasting blood sugar level. This should be done once every 3 years after age 45 if you are at a normal weight and without risk factors for diabetes. Testing should be considered at a younger age or be carried out more frequently if you are overweight and have at least 1 risk factor for diabetes.  Colorectal cancer can be detected and often prevented. Most routine colorectal cancer screening begins at the age of 50 and continues through age 75. However, your health care provider may recommend screening at an earlier age if you have risk factors for colon cancer. On a yearly basis, your health care provider may provide home test kits to check for hidden blood in the stool. A small camera at the end of a tube may be used to directly examine the colon (sigmoidoscopy or colonoscopy) to detect the earliest forms of colorectal cancer. Talk to your health care provider about this at age 50 when routine screening begins. A direct exam of the colon  should be repeated every 5-10 years through age 75, unless early forms of precancerous polyps or small growths are found.  People who are at an increased risk for hepatitis B should be screened for this virus. You are considered at high risk for hepatitis B if:  You were born in a country where hepatitis B occurs often. Talk with your health care provider about which countries are considered high risk.  Your parents were born in a high-risk country and you have not received a shot to protect against hepatitis B (hepatitis B vaccine).  You have HIV or AIDS.  You use needles to inject street drugs.  You live with, or have sex with, someone who has hepatitis B.  You are a man who has sex with other men (MSM).  You get hemodialysis treatment.  You take certain medicines for conditions like cancer, organ transplantation,   and autoimmune conditions.  Hepatitis C blood testing is recommended for all people born from 1945 through 1965 and any individual with known risk factors for hepatitis C.  Healthy men should no longer receive prostate-specific antigen (PSA) blood tests as part of routine cancer screening. Talk to your health care provider about prostate cancer screening.  Testicular cancer screening is not recommended for adolescents or adult males who have no symptoms. Screening includes self-exam, a health care provider exam, and other screening tests. Consult with your health care provider about any symptoms you have or any concerns you have about testicular cancer.  Practice safe sex. Use condoms and avoid high-risk sexual practices to reduce the spread of sexually transmitted infections (STIs).  You should be screened for STIs, including gonorrhea and chlamydia if:  You are sexually active and are younger than 24 years.  You are older than 24 years, and your health care provider tells you that you are at risk for this type of infection.  Your sexual activity has changed since you  were last screened, and you are at an increased risk for chlamydia or gonorrhea. Ask your health care provider if you are at risk.  If you are at risk of being infected with HIV, it is recommended that you take a prescription medicine daily to prevent HIV infection. This is called pre-exposure prophylaxis (PrEP). You are considered at risk if:  You are a man who has sex with other men (MSM).  You are a heterosexual man who is sexually active with multiple partners.  You take drugs by injection.  You are sexually active with a partner who has HIV.  Talk with your health care provider about whether you are at high risk of being infected with HIV. If you choose to begin PrEP, you should first be tested for HIV. You should then be tested every 3 months for as long as you are taking PrEP.  Use sunscreen. Apply sunscreen liberally and repeatedly throughout the day. You should seek shade when your shadow is shorter than you. Protect yourself by wearing long sleeves, pants, a wide-brimmed hat, and sunglasses year round whenever you are outdoors.  Tell your health care provider of new moles or changes in moles, especially if there is a change in shape or color. Also, tell your health care provider if a mole is larger than the size of a pencil eraser.  A one-time screening for abdominal aortic aneurysm (AAA) and surgical repair of large AAAs by ultrasound is recommended for men aged 65-75 years who are current or former smokers.  Stay current with your vaccines (immunizations).   This information is not intended to replace advice given to you by your health care provider. Make sure you discuss any questions you have with your health care provider.   Document Released: 10/16/2007 Document Revised: 05/10/2014 Document Reviewed: 09/14/2010 Elsevier Interactive Patient Education 2016 [employer]vier Inc[employer]  

## 2016-01-19 NOTE — Assessment & Plan Note (Signed)
BMI 43.26. Recommend weight loss of 5-10% of current body weight. Recommend increasing physical activity to 30 minutes of moderate level activity daily. Encourage nutritional intake that focuses on nutrient dense foods and is moderate, varied, and balanced and is low in saturated fats and processed/sugary foods. Continue to monitor.

## 2016-01-28 ENCOUNTER — Other Ambulatory Visit: Payer: Self-pay | Admitting: Internal Medicine

## 2016-02-18 IMAGING — US US ABDOMEN COMPLETE
1 series · 14 of 25 positions shown · non-contrast
Comparison: None in

CLINICAL DATA: Three year history of abdominal pain, history of
hypertension.

EXAM:
ULTRASOUND ABDOMEN COMPLETE

[Series 1: us abdomen complete · 0.20mm/px · 14 of 121 slices shown]
[im 1/121]
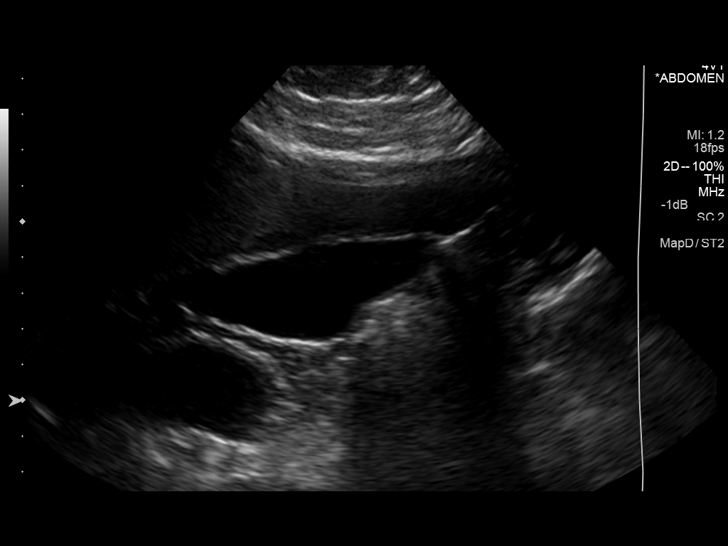
[im 11/121]
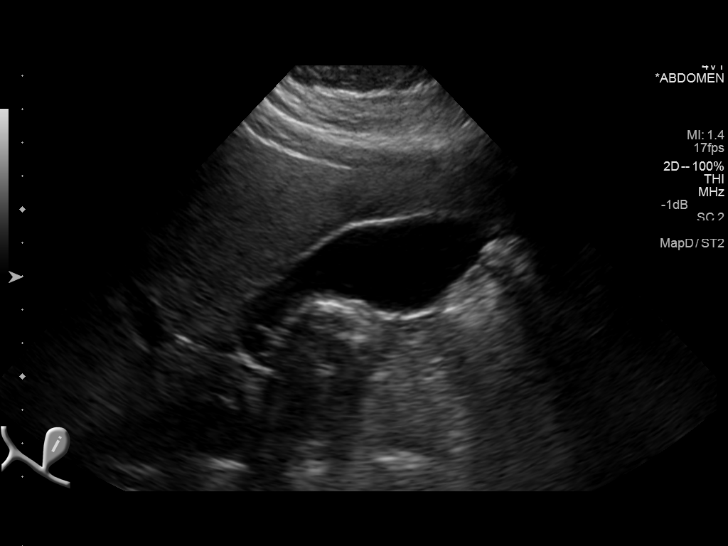
[im 21/121]
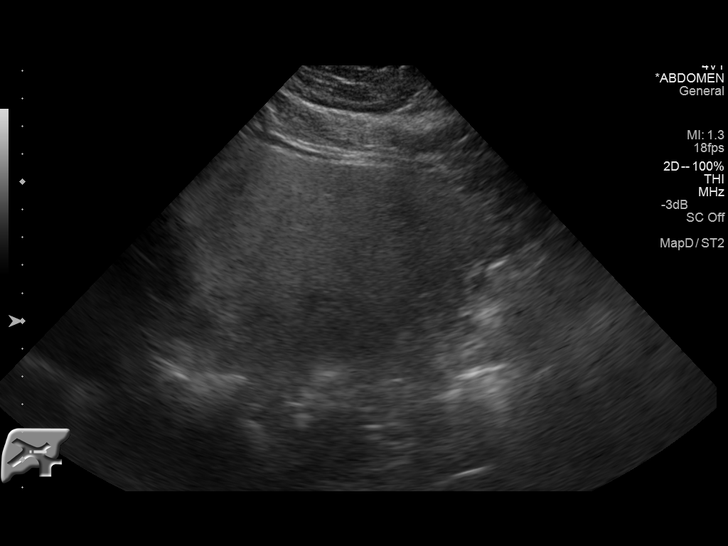
[im 31/121]
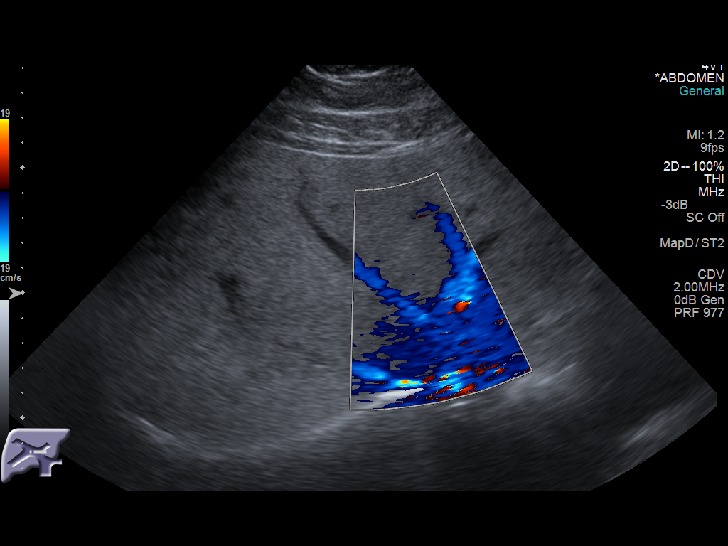
[im 41/121]
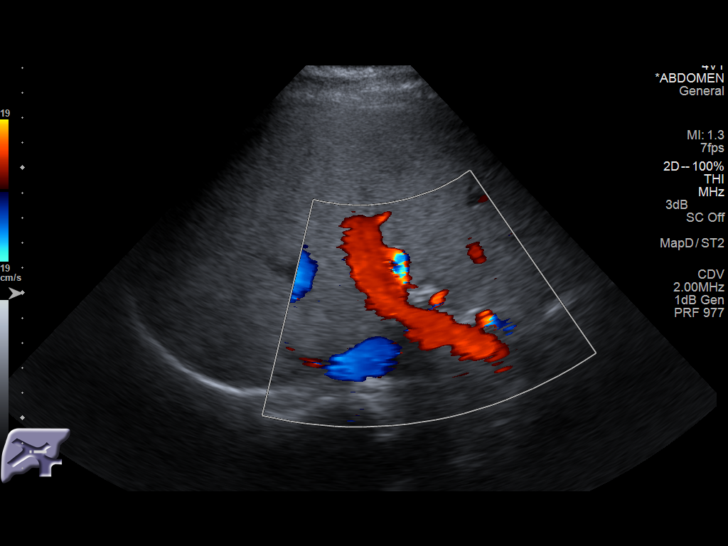
[im 46/121]
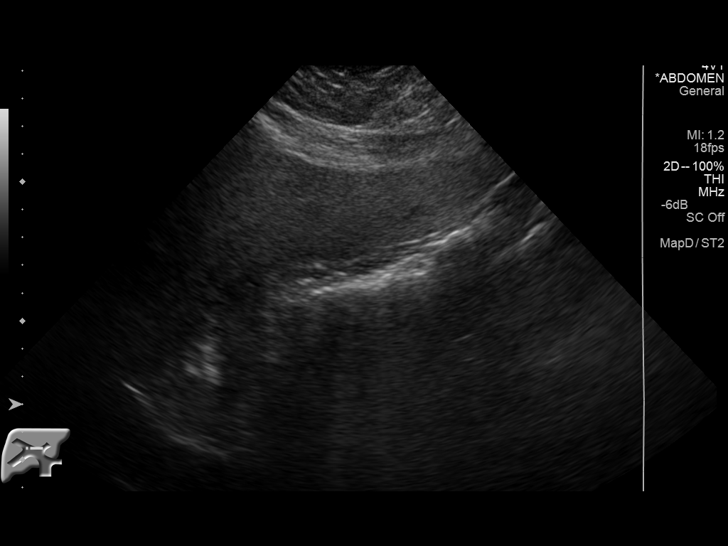
[im 56/121]
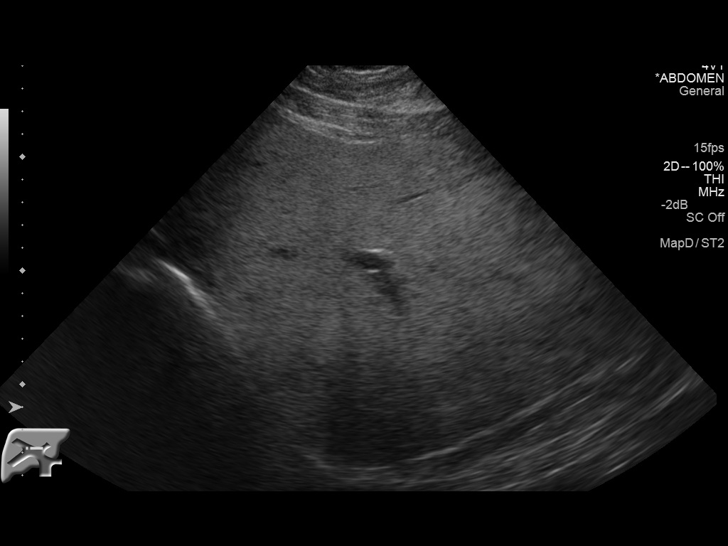
[im 66/121]
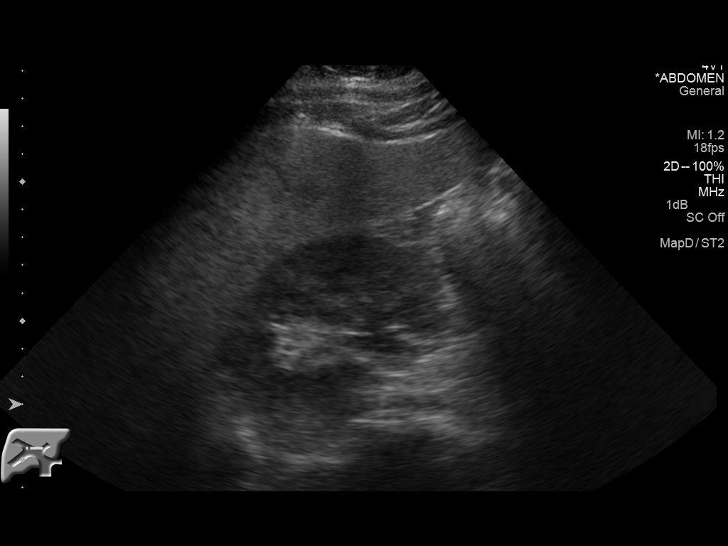
[im 76/121]
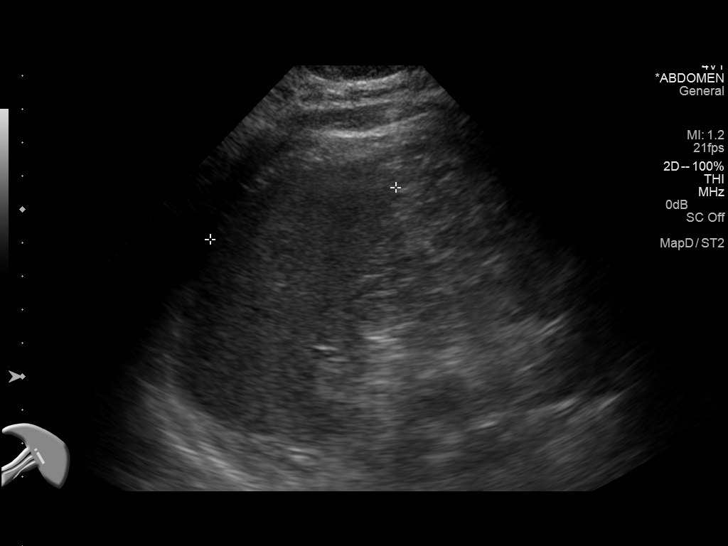
[im 81/121]
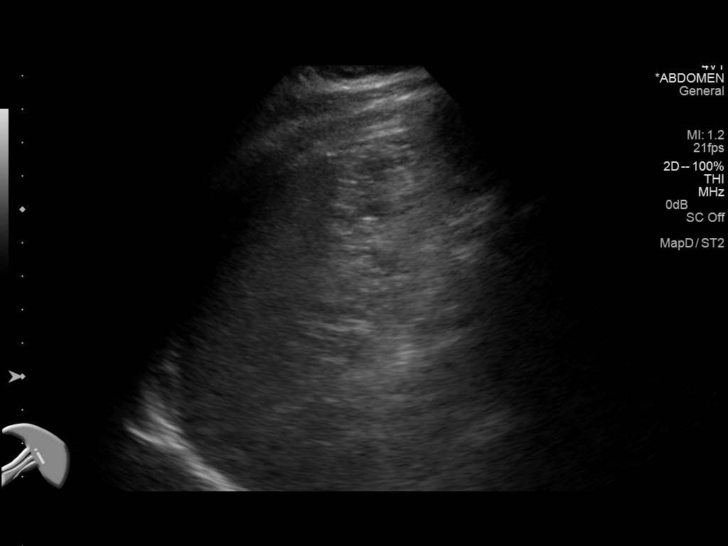
[im 91/121]
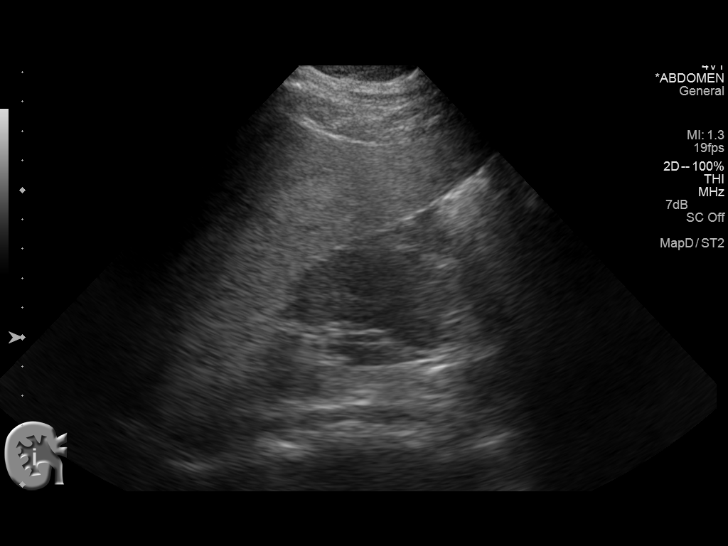
[im 101/121]
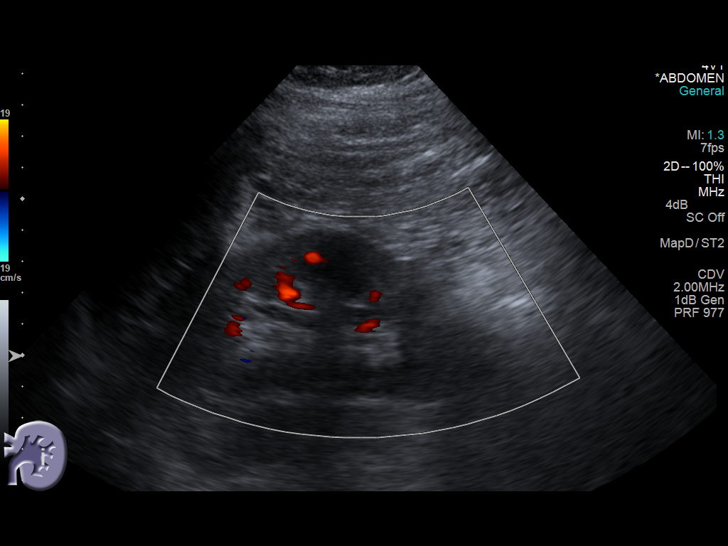
[im 111/121]
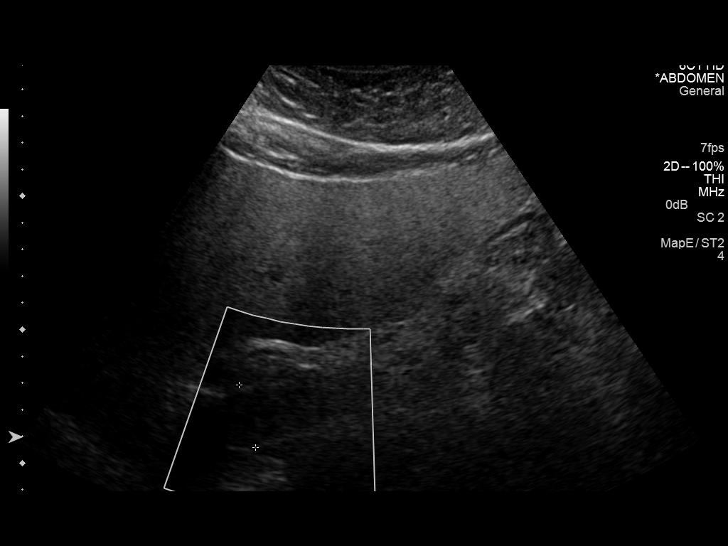
[im 121/121]
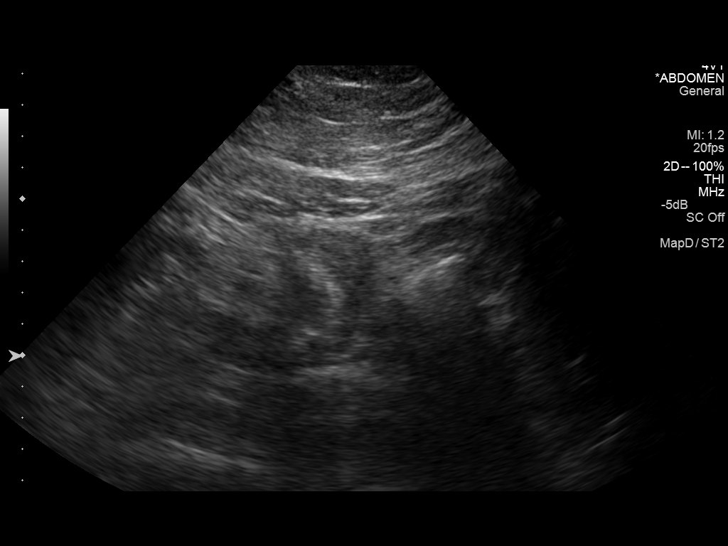

[14 of 25 positions shown; findings below may reference images not displayed]

FINDINGS: Gallbladder: No gallstones or wall thickening visualized. No
sonographic Murphy sign noted.

Common bile duct: Diameter: 4.3 mm

Liver: The hepatic echotexture is mildly increased diffusely. There
is no focal mass or ductal dilation.

IVC: No abnormality visualized.

Pancreas: Visualization of the pancreatic head is limited due to
bowel gas. The pancreatic body and tail are unremarkable.

Spleen: Size and appearance within normal limits.

Right Kidney: Length: 11.8 cm. Echogenicity within normal limits. No
mass or hydronephrosis visualized.

Left Kidney: Length: 11.5 cm. Echogenicity within normal limits. No
mass or hydronephrosis visualized.

Abdominal aorta: There is no aneurysm.

Other findings: No ascites is demonstrated.
IMPRESSION: 1. Fatty infiltrative change of the liver. There is no acute
gallbladder or common bile duct abnormality. The observed portions
of the pancreas and spleen are unremarkable.
2. The kidneys and abdominal aorta are unremarkable.

## 2016-02-28 ENCOUNTER — Other Ambulatory Visit: Payer: Self-pay | Admitting: Internal Medicine

## 2016-04-08 ENCOUNTER — Other Ambulatory Visit: Payer: Self-pay | Admitting: Internal Medicine

## 2016-06-08 ENCOUNTER — Other Ambulatory Visit: Payer: Self-pay | Admitting: Internal Medicine

## 2016-06-08 NOTE — Telephone Encounter (Signed)
Routing to greg, looks like this patient may have transferred care over to you---if yes, are you ok with refillling meds---please advise, thanks

## 2016-07-18 ENCOUNTER — Other Ambulatory Visit: Payer: Self-pay | Admitting: Family

## 2016-08-21 ENCOUNTER — Other Ambulatory Visit: Payer: Self-pay | Admitting: Family

## 2016-10-16 ENCOUNTER — Other Ambulatory Visit: Payer: Self-pay | Admitting: Internal Medicine

## 2016-10-19 MED ORDER — AMLODIPINE BESYLATE 5 MG PO TABS: 5.0000 mg | ORAL_TABLET | Freq: Every day | ORAL | 0 refills | Status: DC

## 2016-10-19 MED ORDER — ALLOPURINOL 100 MG PO TABS: 100.0000 mg | ORAL_TABLET | Freq: Every day | ORAL | 0 refills | Status: DC

## 2016-10-19 NOTE — Telephone Encounter (Signed)
Pt made an appt to see Peter Rivera  Thursday 7/5, he would like to continue seeing Peter Rivera since Peter Rivera is out on Mondays and Mondays are the only days he can come in, he made an appt for 7/5 because he off of work for July 4th. Is Peter Rivera Rivera to continue seeing this Pt?  He made an appt to get his BP meds refilled. Peter Rivera did his CPE in September of 2017.  Please advise.  He would like a refill of Amlodipine and Allopurinol sent [facility] [place]

## 2016-10-19 NOTE — Telephone Encounter (Signed)
Yes, that is fine we can continue to see him. Amlodipine and allopurinol sent to pharmacy. Additional refills provided after follow up office visit.

## 2016-10-19 NOTE — Addendum Note (Signed)
Addended by: Jeanine LuzALONE, Mirranda Monrroy D on: 10/19/2016 05:19 PM   Modules accepted: Orders

## 2016-10-20 NOTE — Telephone Encounter (Signed)
LM informing pt.

## 2016-11-04 ENCOUNTER — Encounter: Payer: Self-pay | Admitting: Family

## 2016-11-04 ENCOUNTER — Ambulatory Visit (INDEPENDENT_AMBULATORY_CARE_PROVIDER_SITE_OTHER): Payer: 59 | Admitting: Family

## 2016-11-04 VITALS — BP 134/94 | HR 89 | Temp 98.3°F | Resp 16 | Ht 64.0 in | Wt 250.0 lb

## 2016-11-04 DIAGNOSIS — I10 Essential (primary) hypertension: Secondary | ICD-10-CM | POA: Diagnosis not present

## 2016-11-04 DIAGNOSIS — Z6841 Body Mass Index (BMI) 40.0 and over, adult: Secondary | ICD-10-CM | POA: Diagnosis not present

## 2016-11-04 NOTE — Patient Instructions (Signed)
Thank you for choosing [facility].  SUMMARY AND INSTRUCTIONS:  Please continue tot take your medication as prescribed.  Continue to work on nutrition and physical activity.  Food selection choices are below.  Follow up:  If your symptoms worsen or fail to improve, please contact our office for further instruction, or in case of emergency go directly to the emergency room at the closest medical facility.    DASH Eating Plan DASH stands for "Dietary Approaches to Stop Hypertension." The DASH eating plan is a healthy eating plan that has been shown to reduce high blood pressure (hypertension). It may also reduce your risk for type 2 diabetes, heart disease, and stroke. The DASH eating plan may also help with weight loss. What are tips for following this plan? General guidelines  Avoid eating more than 2,300 mg (milligrams) of salt (sodium) a day. If you have hypertension, you may need to reduce your sodium intake to 1,500 mg a day.  Limit alcohol intake to no more than 1 drink a day for nonpregnant women and 2 drinks a day for men. One drink equals 12 oz of beer, 5 oz of wine, or 1 oz of hard liquor.  Work with your health care provider to maintain a healthy body weight or to lose weight. Ask what an ideal weight is for you.  Get at least 30 minutes of exercise that causes your heart to beat faster (aerobic exercise) most days of the week. Activities may include walking, swimming, or biking.  Work with your health care provider or diet and nutrition specialist (dietitian) to adjust your eating plan to your individual calorie needs. Reading food labels  Check food labels for the amount of sodium per serving. Choose foods with less than 5 percent of the Daily Value of sodium. Generally, foods with less than 300 mg of sodium per serving fit into this eating plan.  To find whole grains, look for the word "whole" as the first word in the ingredient list. Shopping  Buy products  labeled as "low-sodium" or "no salt added."  Buy fresh foods. Avoid canned foods and premade or frozen meals. Cooking  Avoid adding salt when cooking. Use salt-free seasonings or herbs instead of table salt or sea salt. Check with your health care provider or pharmacist before using salt substitutes.  Do not fry foods. Cook foods using healthy methods such as baking, boiling, grilling, and broiling instead.  Cook with heart-healthy oils, such as olive, canola, soybean, or sunflower oil. Meal planning   Eat a balanced diet that includes: ? 5 or more servings of fruits and vegetables each day. At each meal, try to fill half of your plate with fruits and vegetables. ? Up to 6-8 servings of whole grains each day. ? Less than 6 oz of lean meat, poultry, or fish each day. A 3-oz serving of meat is about the same size as a deck of cards. One egg equals 1 oz. ? 2 servings of low-fat dairy each day. ? A serving of nuts, seeds, or beans 5 times each week. ? Heart-healthy fats. Healthy fats called Omega-3 fatty acids are found in foods such as flaxseeds and coldwater fish, like sardines, salmon, and mackerel.  Limit how much you eat of the following: ? Canned or prepackaged foods. ? Food that is high in trans fat, such as fried foods. ? Food that is high in saturated fat, such as fatty meat. ? Sweets, desserts, sugary drinks, and other foods with added sugar. ?  Full-fat dairy products.  Do not salt foods before eating.  Try to eat at least 2 vegetarian meals each week.  Eat more home-cooked food and less restaurant, buffet, and fast food.  When eating at a restaurant, ask that your food be prepared with less salt or no salt, if possible. What foods are recommended? The items listed may not be a complete list. Talk with your dietitian about what dietary choices are best for you. Grains Whole-grain or whole-wheat bread. Whole-grain or whole-wheat pasta. Brown rice. Orpah Cobb. Bulgur.  Whole-grain and low-sodium cereals. Pita bread. Low-fat, low-sodium crackers. Whole-wheat flour tortillas. Vegetables Fresh or frozen vegetables (raw, steamed, roasted, or grilled). Low-sodium or reduced-sodium tomato and vegetable juice. Low-sodium or reduced-sodium tomato sauce and tomato paste. Low-sodium or reduced-sodium canned vegetables. Fruits All fresh, dried, or frozen fruit. Canned fruit in natural juice (without added sugar). Meat and other protein foods Skinless chicken or [place]. Ground chicken or [place]. Pork with fat trimmed off. Fish and seafood. Egg whites. Dried beans, peas, or lentils. Unsalted nuts, nut butters, and seeds. Unsalted canned beans. Lean cuts of beef with fat trimmed off. Low-sodium, lean deli meat. Dairy Low-fat (1%) or fat-free (skim) milk. Fat-free, low-fat, or reduced-fat cheeses. Nonfat, low-sodium ricotta or cottage cheese. Low-fat or nonfat yogurt. Low-fat, low-sodium cheese. Fats and oils Soft margarine without trans fats. Vegetable oil. Low-fat, reduced-fat, or light mayonnaise and salad dressings (reduced-sodium). Canola, safflower, olive, soybean, and sunflower oils. Avocado. Seasoning and other foods Herbs. Spices. Seasoning mixes without salt. Unsalted popcorn and pretzels. Fat-free sweets. What foods are not recommended? The items listed may not be a complete list. Talk with your dietitian about what dietary choices are best for you. Grains Baked goods made with fat, such as croissants, muffins, or some breads. Dry pasta or rice meal packs. Vegetables Creamed or fried vegetables. Vegetables in a cheese sauce. Regular canned vegetables (not low-sodium or reduced-sodium). Regular canned tomato sauce and paste (not low-sodium or reduced-sodium). Regular tomato and vegetable juice (not low-sodium or reduced-sodium). Rosita Fire. Olives. Fruits Canned fruit in a light or heavy syrup. Fried fruit. Fruit in cream or butter sauce. Meat and other protein  foods Fatty cuts of meat. Ribs. Fried meat. Tomasa Blase. Sausage. Bologna and other processed lunch meats. Salami. Fatback. Hotdogs. Bratwurst. Salted nuts and seeds. Canned beans with added salt. Canned or smoked fish. Whole eggs or egg yolks. Chicken or [place] with skin. Dairy Whole or 2% milk, cream, and half-and-half. Whole or full-fat cream cheese. Whole-fat or sweetened yogurt. Full-fat cheese. Nondairy creamers. Whipped toppings. Processed cheese and cheese spreads. Fats and oils Butter. Stick margarine. Lard. Shortening. Ghee. Bacon fat. Tropical oils, such as coconut, palm kernel, or palm oil. Seasoning and other foods Salted popcorn and pretzels. Onion salt, garlic salt, seasoned salt, table salt, and sea salt. Worcestershire sauce. Tartar sauce. Barbecue sauce. Teriyaki sauce. Soy sauce, including reduced-sodium. Steak sauce. Canned and packaged gravies. Fish sauce. Oyster sauce. Cocktail sauce. Horseradish that you find on the shelf. Ketchup. Mustard. Meat flavorings and tenderizers. Bouillon cubes. Hot sauce and Tabasco sauce. Premade or packaged marinades. Premade or packaged taco seasonings. Relishes. Regular salad dressings. Where to find more information:  National Heart, Lung, and Blood Institute: PopSteam.is  American Heart Association: www.heart.org Summary  The DASH eating plan is a healthy eating plan that has been shown to reduce high blood pressure (hypertension). It may also reduce your risk for type 2 diabetes, heart disease, and stroke.  With the DASH eating plan, you  should limit salt (sodium) intake to 2,300 mg a day. If you have hypertension, you may need to reduce your sodium intake to 1,500 mg a day.  When on the DASH eating plan, aim to eat more fresh fruits and vegetables, whole grains, lean proteins, low-fat dairy, and heart-healthy fats.  Work with your health care provider or diet and nutrition specialist (dietitian) to adjust your eating plan to your  individual calorie needs. This information is not intended to replace advice given to you by your health care provider. Make sure you discuss any questions you have with your health care provider. Document Released: 04/08/2011 Document Revised: 04/12/2016 Document Reviewed: 04/12/2016 Elsevier Interactive Patient Education  2017 [employer].     Why follow it? Research shows. . Those who follow the Mediterranean diet have a reduced risk of heart disease  . The diet is associated with a reduced incidence of Parkinson's and Alzheimer's diseases . People following the diet may have longer life expectancies and lower rates of chronic diseases  . The Dietary Guidelines for Americans recommends the Mediterranean diet as an eating plan to promote health and prevent disease  What Is the Mediterranean Diet?  . Healthy eating plan based on typical foods and recipes of Mediterranean-style cooking . The diet is primarily a plant based diet; these foods should make up a majority of meals   Starches - Plant based foods should make up a majority of meals - They are an important sources of vitamins, minerals, energy, antioxidants, and fiber - Choose whole grains, foods high in fiber and minimally processed items  - Typical grain sources include wheat, oats, barley, corn, brown rice, bulgar, farro, millet, polenta, couscous  - Various types of beans include chickpeas, lentils, fava beans, black beans, white beans   Fruits  Veggies - Large quantities of antioxidant rich fruits & veggies; 6 or more servings  - Vegetables can be eaten raw or lightly drizzled with oil and cooked  - Vegetables common to the traditional Mediterranean Diet include: artichokes, arugula, beets, broccoli, brussel sprouts, cabbage, carrots, celery, collard greens, cucumbers, eggplant, kale, leeks, lemons, lettuce, mushrooms, okra, onions, peas, peppers, potatoes, pumpkin, radishes, rutabaga, shallots, spinach, sweet potatoes, turnips,  zucchini - Fruits common to the Mediterranean Diet include: apples, apricots, avocados, cherries, clementines, dates, figs, grapefruits, grapes, melons, nectarines, oranges, peaches, pears, pomegranates, strawberries, tangerines  Fats - Replace butter and margarine with healthy oils, such as olive oil, canola oil, and tahini  - Limit nuts to no more than a handful a day  - Nuts include walnuts, almonds, pecans, pistachios, pine nuts  - Limit or avoid candied, honey roasted or heavily salted nuts - Olives are central to the [employer] - can be eaten whole or used in a variety of dishes   Meats Protein - Limiting red meat: no more than a few times a month - When eating red meat: choose lean cuts and keep the portion to the size of deck of cards - Eggs: approx. 0 to 4 times a week  - Fish and lean poultry: at least 2 a week  - Healthy protein sources include, chicken, [place], lean beef, lamb - Increase intake of seafood such as tuna, salmon, trout, mackerel, shrimp, scallops - Avoid or limit high fat processed meats such as sausage and bacon  Dairy - Include moderate amounts of low fat dairy products  - Focus on healthy dairy such as fat free yogurt, skim milk, low or reduced fat cheese - Limit  dairy products higher in fat such as whole or 2% milk, cheese, ice cream  Alcohol - Moderate amounts of red wine is ok  - No more than 5 oz daily for women (all ages) and men older than age 37  - No more than 10 oz of wine daily for men younger than 5065  Other - Limit sweets and other desserts  - Use herbs and spices instead of salt to flavor foods  - Herbs and spices common to the traditional Mediterranean Diet include: basil, bay leaves, chives, cloves, cumin, fennel, garlic, lavender, marjoram, mint, oregano, parsley, pepper, rosemary, sage, savory, sumac, tarragon, thyme   It's not just a diet, it's a lifestyle:  . The Mediterranean diet includes lifestyle factors typical of those in the  region  . Foods, drinks and meals are best eaten with others and savored . Daily physical activity is important for overall good health . This could be strenuous exercise like running and aerobics . This could also be more leisurely activities such as walking, housework, yard-work, or taking the stairs . Moderation is the key; a balanced and healthy diet accommodates most foods and drinks . Consider portion sizes and frequency of consumption of certain foods   Meal Ideas & Options:  . Breakfast:  o Whole wheat toast or whole wheat English muffins with peanut butter & hard boiled egg o Steel cut oats topped with apples & cinnamon and skim milk  o Fresh fruit: banana, strawberries, melon, berries, peaches  o Smoothies: strawberries, bananas, greek yogurt, peanut butter o Low fat greek yogurt with blueberries and granola  o Egg white omelet with spinach and mushrooms o Breakfast couscous: whole wheat couscous, apricots, skim milk, cranberries  . Sandwiches:  o Hummus and grilled vegetables (peppers, zucchini, squash) on whole wheat bread   o Grilled chicken on whole wheat pita with lettuce, tomatoes, cucumbers or tzatziki  o Tuna salad on whole wheat bread: tuna salad made with greek yogurt, olives, red peppers, capers, green onions o Garlic rosemary lamb pita: lamb sauted with garlic, rosemary, salt & pepper; add lettuce, cucumber, greek yogurt to pita - flavor with lemon juice and black pepper  . Seafood:  o Mediterranean grilled salmon, seasoned with garlic, basil, parsley, lemon juice and black pepper o Shrimp, lemon, and spinach whole-grain pasta salad made with low fat greek yogurt  o Seared scallops with lemon orzo  o Seared tuna steaks seasoned salt, pepper, coriander topped with tomato mixture of olives, tomatoes, olive oil, minced garlic, parsley, green onions and cappers  . Meats:  o Herbed greek chicken salad with kalamata olives, cucumber, feta  o Red bell peppers stuffed with  spinach, bulgur, lean ground beef (or lentils) & topped with feta   o Kebabs: skewers of chicken, tomatoes, onions, zucchini, squash  o [place] burgers: made with red onions, mint, dill, lemon juice, feta cheese topped with roasted red peppers . Vegetarian o Cucumber salad: cucumbers, artichoke hearts, celery, red onion, feta cheese, tossed in olive oil & lemon juice  o Hummus and whole grain pita points with a greek salad (lettuce, tomato, feta, olives, cucumbers, red onion) o Lentil soup with celery, carrots made with vegetable broth, garlic, salt and pepper  o Tabouli salad: parsley, bulgur, mint, scallions, cucumbers, tomato, radishes, lemon juice, olive oil, salt and pepper.

## 2016-11-04 NOTE — Assessment & Plan Note (Signed)
BMI of 42.91. Recommend weight loss of 5-10% of current body weight. Recommend increasing physical activity to 30 minutes of moderate level activity daily. Encourage nutritional intake that focuses on nutrient dense foods and is moderate, varied, and balanced and is low in saturated fats and processed/sugary foods. Continue to monitor.

## 2016-11-04 NOTE — Progress Notes (Signed)
Subjective:    Patient ID: Peter Rivera, male    DOB: 05/07/1980, 36 y.o.   MRN: [id number]  Chief Complaint  Patient presents with  . Follow-up    blood pressure    HPI:  Peter Rivera is a 36 y.o. 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 and presents today for a follow up office visit.   1.) Hypertension - Currently maintained on irbesartan and amlodipine. Reports taking the medication as prescribed and denies adverse side effects or hypotensive readings. Denies worse headache of life with no new symptoms of end organ damage. Working on low sodium diet. Not currently checking his blood pressure at home.   BP Readings from Last 3 Encounters:  11/04/16 (!) 134/94  01/19/16 130/90  03/25/15 (!) 163/99      No Known Allergies    Outpatient Medications Prior to Visit  Medication Sig Dispense Refill  . allopurinol (ZYLOPRIM) 100 MG tablet Take 1 tablet (100 mg total) by mouth daily. 30 tablet 0  . amLODipine (NORVASC) 5 MG tablet Take 1 tablet (5 mg total) by mouth daily. 30 tablet 0  . EPINEPHrine (EPIPEN 2-PAK) 0.3 mg/0.3 mL IJ SOAJ injection Inject 0.3 mLs (0.3 mg total) into the muscle once. 2 Device 1  . irbesartan (AVAPRO) 300 MG tablet TAKE ONE TABLET BY MOUTH AT BEDTIME 30 tablet 11  . sildenafil (VIAGRA) 100 MG tablet Take 0.5-1 tablets (50-100 mg total) by mouth daily as needed for erectile dysfunction. 10 tablet 2   No facility-administered medications prior to visit.     Review of Systems  Constitutional: Negative for chills and fever.  Eyes:       Negative for changes in vision  Respiratory: Negative for cough, chest tightness and wheezing.   Cardiovascular: Negative for chest pain, palpitations and leg swelling.  Neurological: Negative for dizziness, weakness and light-headedness.        Objective:    BP (!) 134/94 (BP Location: Left Arm, Patient Position: Sitting, Cuff Size: Normal)   Pulse 89   Temp 98.3 F (36.8 C) (Oral)   Resp 16   Ht 5\' 4"  (1.626 m)   Wt 250 lb (113.4 kg)   SpO2 97%   BMI 42.91 kg/m  Nursing note and vital signs reviewed.  Physical Exam  Constitutional: He is oriented to person, place, and time. He appears well-developed and well-nourished. No distress.  Cardiovascular: Normal rate, regular rhythm, normal heart sounds and intact distal pulses.   Pulmonary/Chest: Effort normal and breath sounds normal.  Neurological: He is alert and oriented to person, place, and time.  Skin: Skin is warm and dry.  Psychiatric: He has a normal mood and affect. His behavior is normal. Judgment and thought content normal.       Assessment & Plan:   Problem List Items Addressed This Visit      Cardiovascular and Mediastinum   HTN (hypertension) - Primary    Blood pressure remains labile with current medication regimen and close to goal of 140/90. Continue current dosage of amlodipine and Avapro. Discussed in detail the importance of nutrition and physical activity with information on DASH diet provided as well as Mediterranean-style intake. Encouraged to monitor blood pressure at home and follow low-sodium diet. Denies worse headache of life with no new symptoms of end organ damage noted on physical exam. Follow-up in one month or sooner.  Other   Obesity, morbid (HCC)    BMI of 42.91. Recommend weight loss of 5-10% of current body weight. Recommend increasing physical activity to 30 minutes of moderate level activity daily. Encourage nutritional intake that focuses on nutrient dense foods and is moderate, varied, and balanced and is low in saturated fats and processed/sugary foods. Continue to monitor.           I have discontinued [name]'s sildenafil. I am also having him maintain his EPINEPHrine, irbesartan, allopurinol, and  amLODipine.   Follow-up: Return in about 1 month (around 12/05/2016), or if symptoms worsen or fail to improve.   Jeanine Luzalone, Estefany Goebel, FNP

## 2016-11-04 NOTE — Assessment & Plan Note (Signed)
Blood pressure remains labile with current medication regimen and close to goal of 140/90. Continue current dosage of amlodipine and Avapro. Discussed in detail the importance of nutrition and physical activity with information on DASH diet provided as well as Mediterranean-style intake. Encouraged to monitor blood pressure at home and follow low-sodium diet. Denies worse headache of life with no new symptoms of end organ damage noted on physical exam. Follow-up in one month or sooner.

## 2016-11-22 ENCOUNTER — Other Ambulatory Visit: Payer: Self-pay | Admitting: Family

## 2016-12-06 ENCOUNTER — Ambulatory Visit: Payer: 59 | Admitting: Family

## 2016-12-20 ENCOUNTER — Ambulatory Visit (INDEPENDENT_AMBULATORY_CARE_PROVIDER_SITE_OTHER): Payer: 59 | Admitting: Family

## 2016-12-20 ENCOUNTER — Encounter: Payer: Self-pay | Admitting: Family

## 2016-12-20 VITALS — BP 140/94 | HR 108 | Temp 98.5°F | Resp 16 | Ht 64.0 in | Wt 255.6 lb

## 2016-12-20 DIAGNOSIS — I10 Essential (primary) hypertension: Secondary | ICD-10-CM

## 2016-12-20 MED ORDER — AMLODIPINE BESYLATE 10 MG PO TABS: 10.0000 mg | ORAL_TABLET | Freq: Every day | ORAL | 1 refills | Status: DC

## 2016-12-20 NOTE — Assessment & Plan Note (Signed)
Blood pressure remains elevated above goal 140/90 with current medication regimen and no adverse side effects. 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 Discussed possible solutions with a goal of increasing physical activity and decreasing processed/sugary foods and refined carbohydrates. Increase amlodipine. Continue current dosage of Avapro. Encouraged to monitor blood pressure at home and follow sodium diet. Follow-up in 3-4 weeks.

## 2016-12-20 NOTE — Patient Instructions (Addendum)
Thank you for choosing [facility].  SUMMARY AND INSTRUCTIONS:  Please increase the amlodipine. Continue to take the avapro.  Monitor blood pressure at home.  Check out the Ketogenic diet.   Follow up in 3-4 weeks.   Medication:  Your prescription(s) have been submitted to your pharmacy or been printed and provided for you. Please take as directed and contact our office if you believe you are having problem(s) with the medication(s) or have any questions.  Follow up:  If your symptoms worsen or fail to improve, please contact our office for further instruction, or in case of emergency go directly to the emergency room at the closest medical facility.       Why follow it? Research shows. . Those who follow the Mediterranean diet have a reduced risk of heart disease  . The diet is associated with a reduced incidence of Parkinson's and Alzheimer's diseases . People following the diet may have longer life expectancies and lower rates of chronic diseases  . The Dietary Guidelines for Americans recommends the Mediterranean diet as an eating plan to promote health and prevent disease  What Is the Mediterranean Diet?  . Healthy eating plan based on typical foods and recipes of Mediterranean-style cooking . The diet is primarily a plant based diet; these foods should make up a majority of meals   Starches - Plant based foods should make up a majority of meals - They are an important sources of vitamins, minerals, energy, antioxidants, and fiber - Choose whole grains, foods high in fiber and minimally processed items  - Typical grain sources include wheat, oats, barley, corn, brown rice, bulgar, farro, millet, polenta, couscous  - Various types of beans include chickpeas, lentils, fava beans, black beans, white beans   Fruits  Veggies - Large quantities of antioxidant rich fruits & veggies; 6 or more servings  - Vegetables can be eaten raw or lightly drizzled with oil and cooked  -  Vegetables common to the traditional Mediterranean Diet include: artichokes, arugula, beets, broccoli, brussel sprouts, cabbage, carrots, celery, collard greens, cucumbers, eggplant, kale, leeks, lemons, lettuce, mushrooms, okra, onions, peas, peppers, potatoes, pumpkin, radishes, rutabaga, shallots, spinach, sweet potatoes, turnips, zucchini - Fruits common to the Mediterranean Diet include: apples, apricots, avocados, cherries, clementines, dates, figs, grapefruits, grapes, melons, nectarines, oranges, peaches, pears, pomegranates, strawberries, tangerines  Fats - Replace butter and margarine with healthy oils, such as olive oil, canola oil, and tahini  - Limit nuts to no more than a handful a day  - Nuts include walnuts, almonds, pecans, pistachios, pine nuts  - Limit or avoid candied, honey roasted or heavily salted nuts - Olives are central to the [employer] - can be eaten whole or used in a variety of dishes   Meats Protein - Limiting red meat: no more than a few times a month - When eating red meat: choose lean cuts and keep the portion to the size of deck of cards - Eggs: approx. 0 to 4 times a week  - Fish and lean poultry: at least 2 a week  - Healthy protein sources include, chicken, [place], lean beef, lamb - Increase intake of seafood such as tuna, salmon, trout, mackerel, shrimp, scallops - Avoid or limit high fat processed meats such as sausage and bacon  Dairy - Include moderate amounts of low fat dairy products  - Focus on healthy dairy such as fat free yogurt, skim milk, low or reduced fat cheese - Limit dairy products higher in  fat such as whole or 2% milk, cheese, ice cream  Alcohol - Moderate amounts of red wine is ok  - No more than 5 oz daily for women (all ages) and men older than age 102  - No more than 10 oz of wine daily for men younger than 52  Other - Limit sweets and other desserts  - Use herbs and spices instead of salt to flavor foods  - Herbs and spices  common to the traditional Mediterranean Diet include: basil, bay leaves, chives, cloves, cumin, fennel, garlic, lavender, marjoram, mint, oregano, parsley, pepper, rosemary, sage, savory, sumac, tarragon, thyme   It's not just a diet, it's a lifestyle:  . The Mediterranean diet includes lifestyle factors typical of those in the region  . Foods, drinks and meals are best eaten with others and savored . Daily physical activity is important for overall good health . This could be strenuous exercise like running and aerobics . This could also be more leisurely activities such as walking, housework, yard-work, or taking the stairs . Moderation is the key; a balanced and healthy diet accommodates most foods and drinks . Consider portion sizes and frequency of consumption of certain foods   Meal Ideas & Options:  . Breakfast:  o Whole wheat toast or whole wheat English muffins with peanut butter & hard boiled egg o Steel cut oats topped with apples & cinnamon and skim milk  o Fresh fruit: banana, strawberries, melon, berries, peaches  o Smoothies: strawberries, bananas, greek yogurt, peanut butter o Low fat greek yogurt with blueberries and granola  o Egg white omelet with spinach and mushrooms o Breakfast couscous: whole wheat couscous, apricots, skim milk, cranberries  . Sandwiches:  o Hummus and grilled vegetables (peppers, zucchini, squash) on whole wheat bread   o Grilled chicken on whole wheat pita with lettuce, tomatoes, cucumbers or tzatziki  o Tuna salad on whole wheat bread: tuna salad made with greek yogurt, olives, red peppers, capers, green onions o Garlic rosemary lamb pita: lamb sauted with garlic, rosemary, salt & pepper; add lettuce, cucumber, greek yogurt to pita - flavor with lemon juice and black pepper  . Seafood:  o Mediterranean grilled salmon, seasoned with garlic, basil, parsley, lemon juice and black pepper o Shrimp, lemon, and spinach whole-grain pasta salad made with  low fat greek yogurt  o Seared scallops with lemon orzo  o Seared tuna steaks seasoned salt, pepper, coriander topped with tomato mixture of olives, tomatoes, olive oil, minced garlic, parsley, green onions and cappers  . Meats:  o Herbed greek chicken salad with kalamata olives, cucumber, feta  o Red bell peppers stuffed with spinach, bulgur, lean ground beef (or lentils) & topped with feta   o Kebabs: skewers of chicken, tomatoes, onions, zucchini, squash  o [place] burgers: made with red onions, mint, dill, lemon juice, feta cheese topped with roasted red peppers . Vegetarian o Cucumber salad: cucumbers, artichoke hearts, celery, red onion, feta cheese, tossed in olive oil & lemon juice  o Hummus and whole grain pita points with a greek salad (lettuce, tomato, feta, olives, cucumbers, red onion) o Lentil soup with celery, carrots made with vegetable broth, garlic, salt and pepper  o Tabouli salad: parsley, bulgur, mint, scallions, cucumbers, tomato, radishes, lemon juice, olive oil, salt and pepper.

## 2016-12-20 NOTE — Progress Notes (Signed)
Subjective:    Patient ID: Peter Rivera, male    DOB: 05-03-1981, 36 y.o.   MRN: [id number]  Chief Complaint  Patient presents with  . Follow-up    blood pressure    HPI:  Peter Rivera is a 36 y.o. 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 and presents today for an a follow up office visit.  Currently maintained on Avapro and amlodipine and reports taking the medications as prescribed and denies adverse side effects or hypotensive readings. Does not currently check blood pressure at home. Has been under a good amount of family stressors as his wife is out of work.  Denies changes in vision, worst headache of life or new symptoms of end organ damage. Working on following a low sodium diet.   BP Readings from Last 3 Encounters:  12/20/16 (!) 140/94  11/04/16 (!) 134/94  01/19/16 130/90     No Known Allergies    Outpatient Medications Prior to Visit  Medication Sig Dispense Refill  . allopurinol (ZYLOPRIM) 100 MG tablet TAKE ONE TABLET BY MOUTH DAILY 30 tablet 0  . EPINEPHrine (EPIPEN 2-PAK) 0.3 mg/0.3 mL IJ SOAJ injection Inject 0.3 mLs (0.3 mg total) into the muscle once. 2 Device 1  . irbesartan (AVAPRO) 300 MG tablet TAKE ONE TABLET BY MOUTH AT BEDTIME 30 tablet 11  . amLODipine (NORVASC) 5 MG tablet TAKE ONE TABLET BY MOUTH DAILY 30 tablet 0   No facility-administered medications prior to visit.     Review of Systems  Constitutional: Negative for chills and fever.  Eyes:       Negative for changes in vision  Respiratory: Negative for cough, chest tightness and wheezing.   Cardiovascular: Negative for chest pain, palpitations and leg swelling.  Neurological: Negative for dizziness, weakness and light-headedness.      Objective:    BP (!) 140/94 (BP Location: Left Arm, Patient Position: Sitting, Cuff  Size: Large)   Pulse (!) 108   Temp 98.5 F (36.9 C) (Oral)   Resp 16   Ht 5\' 4"  (1.626 m)   Wt 255 lb 9.6 oz (115.9 kg)   SpO2 96%   BMI 43.87 kg/m  Nursing note and vital signs reviewed.  Physical Exam  Constitutional: He is oriented to person, place, and time. He appears well-developed and well-nourished. No distress.  Cardiovascular: Normal rate, regular rhythm, normal heart sounds and intact distal pulses.   Pulmonary/Chest: Effort normal and breath sounds normal.  Neurological: He is alert and oriented to person, place, and time.  Skin: Skin is warm and dry.  Psychiatric: He has a normal mood and affect. His behavior is normal. Judgment and thought content normal.       Assessment & Plan:   Problem List Items Addressed This Visit      Cardiovascular and Mediastinum   HTN (hypertension) - Primary    Blood pressure remains elevated above goal 140/90 with current medication regimen and no adverse side effects. 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 Discussed possible solutions with a goal of increasing physical activity and decreasing processed/sugary foods and refined carbohydrates. Increase amlodipine. Continue current dosage of Avapro. Encouraged to monitor blood pressure at home and follow sodium diet. Follow-up in 3-4 weeks.      Relevant Medications   amLODipine (NORVASC) 10 MG tablet       I have changed Mr. Broaddus's amLODipine. I am  also having him maintain his EPINEPHrine, irbesartan, and allopurinol.   Meds ordered this encounter  Medications  . amLODipine (NORVASC) 10 MG tablet    Sig: Take 1 tablet (10 mg total) by mouth daily.    Dispense:  30 tablet    Refill:  1    Order Specific Question:   Supervising Provider    Answer:   Hillard Danker A [[id number]]     Follow-up: Return in about 3 weeks (around 01/10/2017), or if symptoms worsen or fail to improve.  Jeanine Luz, FNP

## 2016-12-28 ENCOUNTER — Other Ambulatory Visit: Payer: Self-pay | Admitting: Family

## 2017-01-10 ENCOUNTER — Encounter: Payer: 59 | Admitting: Family

## 2017-01-17 ENCOUNTER — Encounter: Payer: 59 | Admitting: Family

## 2017-01-19 ENCOUNTER — Ambulatory Visit (INDEPENDENT_AMBULATORY_CARE_PROVIDER_SITE_OTHER): Payer: 59 | Admitting: Nurse Practitioner

## 2017-01-19 ENCOUNTER — Encounter: Payer: Self-pay | Admitting: Nurse Practitioner

## 2017-01-19 ENCOUNTER — Other Ambulatory Visit (INDEPENDENT_AMBULATORY_CARE_PROVIDER_SITE_OTHER): Payer: 59

## 2017-01-19 VITALS — BP 132/92 | HR 101 | Temp 97.7°F | Ht 64.0 in | Wt 253.0 lb

## 2017-01-19 DIAGNOSIS — Z23 Encounter for immunization: Secondary | ICD-10-CM | POA: Diagnosis not present

## 2017-01-19 DIAGNOSIS — E781 Pure hyperglyceridemia: Secondary | ICD-10-CM

## 2017-01-19 DIAGNOSIS — R7302 Impaired glucose tolerance (oral): Secondary | ICD-10-CM | POA: Diagnosis not present

## 2017-01-19 DIAGNOSIS — Z0000 Encounter for general adult medical examination without abnormal findings: Secondary | ICD-10-CM

## 2017-01-19 DIAGNOSIS — I10 Essential (primary) hypertension: Secondary | ICD-10-CM

## 2017-01-19 DIAGNOSIS — Z0001 Encounter for general adult medical examination with abnormal findings: Secondary | ICD-10-CM | POA: Diagnosis not present

## 2017-01-19 DIAGNOSIS — M1A9XX0 Chronic gout, unspecified, without tophus (tophi): Secondary | ICD-10-CM | POA: Diagnosis not present

## 2017-01-19 LAB — CBC WITH DIFFERENTIAL/PLATELET
Basophils Absolute: 0.1 10*3/uL (ref 0.0–0.1)
Basophils Relative: 0.6 % (ref 0.0–3.0)
EOS PCT: 0.9 % (ref 0.0–5.0)
Eosinophils Absolute: 0.1 10*3/uL (ref 0.0–0.7)
HCT: 43.9 % (ref 39.0–52.0)
HEMOGLOBIN: 14.5 g/dL (ref 13.0–17.0)
LYMPHS PCT: 17.7 % (ref 12.0–46.0)
Lymphs Abs: 1.9 10*3/uL (ref 0.7–4.0)
MCHC: 33.1 g/dL (ref 30.0–36.0)
MCV: 77.1 fl — AB (ref 78.0–100.0)
MONOS PCT: 6.2 % (ref 3.0–12.0)
Monocytes Absolute: 0.7 10*3/uL (ref 0.1–1.0)
Neutro Abs: 8 10*3/uL — ABNORMAL HIGH (ref 1.4–7.7)
Neutrophils Relative %: 74.6 % (ref 43.0–77.0)
Platelets: 331 10*3/uL (ref 150.0–400.0)
RBC: 5.7 Mil/uL (ref 4.22–5.81)
RDW: 14.6 % (ref 11.5–15.5)
WBC: 10.7 10*3/uL — AB (ref 4.0–10.5)

## 2017-01-19 LAB — HEPATIC FUNCTION PANEL
ALT: 31 U/L (ref 0–53)
AST: 23 U/L (ref 0–37)
Albumin: 4.1 g/dL (ref 3.5–5.2)
Alkaline Phosphatase: 98 U/L (ref 39–117)
BILIRUBIN DIRECT: 0.1 mg/dL (ref 0.0–0.3)
Total Bilirubin: 0.5 mg/dL (ref 0.2–1.2)
Total Protein: 8.1 g/dL (ref 6.0–8.3)

## 2017-01-19 LAB — BASIC METABOLIC PANEL
BUN: 16 mg/dL (ref 6–23)
CO2: 25 mEq/L (ref 19–32)
CREATININE: 1.25 mg/dL (ref 0.40–1.50)
Calcium: 9.6 mg/dL (ref 8.4–10.5)
Chloride: 104 mEq/L (ref 96–112)
GFR: 83.68 mL/min (ref >60.00)
GLUCOSE: 88 mg/dL (ref 70–99)
Potassium: 3.6 mEq/L (ref 3.5–5.1)
Sodium: 138 mEq/L (ref 135–145)

## 2017-01-19 LAB — LIPID PANEL
CHOLESTEROL: 174 mg/dL (ref 0–200)
HDL: 53 mg/dL (ref >39.00)
LDL CALC: 85 mg/dL (ref 0–99)
NONHDL: 120.5
Total CHOL/HDL Ratio: 3
Triglycerides: 176 mg/dL — ABNORMAL HIGH (ref 0.0–149.0)
VLDL: 35.2 mg/dL (ref 0.0–40.0)

## 2017-01-19 LAB — URIC ACID: Uric Acid, Serum: 8.4 mg/dL — ABNORMAL HIGH (ref 4.0–7.8)

## 2017-01-19 LAB — TSH: TSH: 0.78 u[IU]/mL (ref 0.35–4.50)

## 2017-01-19 LAB — HEMOGLOBIN A1C: Hgb A1c MFr Bld: 5.8 % (ref 4.6–6.5)

## 2017-01-19 NOTE — Patient Instructions (Addendum)
Lipid panel indicates persistent elevated triglyceride which is due to poor diet and lack of exercise.  Mild increase in uric acid which puts you at risk for another gout flare. Need to increase allopurinol to  daily and improve diet.  Normal liver and renal function, normal TSH. Stable cbc and hgbA1c.  Medication refills sent  Exercising to Lose Weight Exercising can help you to lose weight. In order to lose weight through exercise, you need to do vigorous-intensity exercise. You can tell that you are exercising with vigorous intensity if you are breathing very hard and fast and cannot hold a conversation while exercising. Moderate-intensity exercise helps to maintain your current weight. You can tell that you are exercising at a moderate level if you have a higher heart rate and faster breathing, but you are still able to hold a conversation. How often should I exercise? Choose an activity that you enjoy and set realistic goals. Your health care provider can help you to make an activity plan that works for you. Exercise regularly as directed by your health care provider. This may include:  Doing resistance training twice each week, such as: ? Push-ups. ? Sit-ups. ? Lifting weights. ? Using resistance bands.  Doing a given intensity of exercise for a given amount of time. Choose from these options: ? 150 minutes of moderate-intensity exercise every week. ? 75 minutes of vigorous-intensity exercise every week. ? A mix of moderate-intensity and vigorous-intensity exercise every week.  Children, pregnant women, people who are out of shape, people who are overweight, and older adults may need to consult a health care provider for individual recommendations. If you have any sort of medical condition, be sure to consult your health care provider before starting a new exercise program. What are some activities that can help me to lose weight?  Walking at a rate of at least 4.5 miles an  hour.  Jogging or running at a rate of 5 miles per hour.  Biking at a rate of at least 10 miles per hour.  Lap swimming.  Roller-skating or in-line skating.  Cross-country skiing.  Vigorous competitive sports, such as football, basketball, and soccer.  Jumping rope.  Aerobic dancing. How can I be more active in my day-to-day activities?  Use the stairs instead of the elevator.  Take a walk during your lunch break.  If you drive, park your car farther away from work or school.  If you take public transportation, get off one stop early and walk the rest of the way.  Make all of your phone calls while standing up and walking around.  Get up, stretch, and walk around every 30 minutes throughout the day. What guidelines should I follow while exercising?  Do not exercise so much that you hurt yourself, feel dizzy, or get very short of breath.  Consult your health care provider prior to starting a new exercise program.  Wear comfortable clothes and shoes with good support.  Drink plenty of water while you exercise to prevent dehydration or heat stroke. Body water is lost during exercise and must be replaced.  Work out until you breathe faster and your heart beats faster. This information is not intended to replace advice given to you by your health care provider. Make sure you discuss any questions you have with your health care provider. Document Released: 05/22/2010 Document Revised: 09/25/2015 Document Reviewed: 09/20/2013 Elsevier Interactive Patient Education  2018 [employer].   Calorie Counting for [employer] Loss Calories are units of energy.  Your body needs a certain amount of calories from food to keep you going throughout the day. When you eat more calories than your body needs, your body stores the extra calories as fat. When you eat fewer calories than your body needs, your body burns fat to get the energy it needs. Calorie counting means keeping track of how many  calories you eat and drink each day. Calorie counting can be helpful if you need to lose weight. If you make sure to eat fewer calories than your body needs, you should lose weight. Ask your health care provider what a healthy weight is for you. For calorie counting to work, you will need to eat the right number of calories in a day in order to lose a healthy amount of weight per week. A dietitian can help you determine how many calories you need in a day and will give you suggestions on how to reach your calorie goal.  A healthy amount of weight to lose per week is usually 1-2 lb (0.5-0.9 kg). This usually means that your daily calorie intake should be reduced by 500-750 calories.  Eating 1,200 - 1,500 calories per day can help most women lose weight.  Eating 1,500 - 1,800 calories per day can help most men lose weight.  What is my plan? My goal is to have __________ calories per day. If I have this many calories per day, I should lose around __________ pounds per week. What do I need to know about calorie counting? In order to meet your daily calorie goal, you will need to:  Find out how many calories are in each food you would like to eat. Try to do this before you eat.  Decide how much of the food you plan to eat.  Write down what you ate and how many calories it had. Doing this is called keeping a food log.  To successfully lose weight, it is important to balance calorie counting with a healthy lifestyle that includes regular activity. Aim for 150 minutes of moderate exercise (such as walking) or 75 minutes of vigorous exercise (such as running) each week. Where do I find calorie information?  The number of calories in a food can be found on a Nutrition Facts label. If a food does not have a Nutrition Facts label, try to look up the calories online or ask your dietitian for help. Remember that calories are listed per serving. If you choose to have more than one serving of a food, you  will have to multiply the calories per serving by the amount of servings you plan to eat. For example, the label on a package of bread might say that a serving size is 1 slice and that there are 90 calories in a serving. If you eat 1 slice, you will have eaten 90 calories. If you eat 2 slices, you will have eaten 180 calories. How do I keep a food log? Immediately after each meal, record the following information in your food log:  What you ate. Don't forget to include toppings, sauces, and other extras on the food.  How much you ate. This can be measured in cups, ounces, or number of items.  How many calories each food and drink had.  The total number of calories in the meal.  Keep your food log near you, such as in a small notebook in your pocket, or use a mobile app or website. Some programs will calculate calories for you and show you  how many calories you have left for the day to meet your goal. What are some calorie counting tips?  Use your calories on foods and drinks that will fill you up and not leave you hungry: ? Some examples of foods that fill you up are nuts and nut butters, vegetables, lean proteins, and high-fiber foods like whole grains. High-fiber foods are foods with more than 5 g fiber per serving. ? Drinks such as sodas, specialty coffee drinks, alcohol, and juices have a lot of calories, yet do not fill you up.  Eat nutritious foods and avoid empty calories. Empty calories are calories you get from foods or beverages that do not have many vitamins or protein, such as candy, sweets, and soda. It is better to have a nutritious high-calorie food (such as an avocado) than a food with few nutrients (such as a bag of chips).  Know how many calories are in the foods you eat most often. This will help you calculate calorie counts faster.  Pay attention to calories in drinks. Low-calorie drinks include water and unsweetened drinks.  Pay attention to nutrition labels for "low  fat" or "fat free" foods. These foods sometimes have the same amount of calories or more calories than the full fat versions. They also often have added sugar, starch, or salt, to make up for flavor that was removed with the fat.  Find a way of tracking calories that works for you. Get creative. Try different apps or programs if writing down calories does not work for you. What are some portion control tips?  Know how many calories are in a serving. This will help you know how many servings of a certain food you can have.  Use a measuring cup to measure serving sizes. You could also try weighing out portions on a kitchen scale. With time, you will be able to estimate serving sizes for some foods.  Take some time to put servings of different foods on your favorite plates, bowls, and cups so you know what a serving looks like.  Try not to eat straight from a bag or box. Doing this can lead to overeating. Put the amount you would like to eat in a cup or on a plate to make sure you are eating the right portion.  Use smaller plates, glasses, and bowls to prevent overeating.  Try not to multitask (for example, watch TV or use your computer) while eating. If it is time to eat, sit down at a table and enjoy your food. This will help you to know when you are full. It will also help you to be aware of what you are eating and how much you are eating. What are tips for following this plan? Reading food labels  Check the calorie count compared to the serving size. The serving size may be smaller than what you are used to eating.  Check the source of the calories. Make sure the food you are eating is high in vitamins and protein and low in saturated and trans fats. Shopping  Read nutrition labels while you shop. This will help you make healthy decisions before you decide to purchase your food.  Make a grocery list and stick to it. Cooking  Try to cook your favorite foods in a healthier way. For  example, try baking instead of frying.  Use low-fat dairy products. Meal planning  Use more fruits and vegetables. Half of your plate should be fruits and vegetables.  Include lean proteins like  poultry and fish. How do I count calories when eating out?  Ask for smaller portion sizes.  Consider sharing an entree and sides instead of getting your own entree.  If you get your own entree, eat only half. Ask for a box at the beginning of your meal and put the rest of your entree in it so you are not tempted to eat it.  If calories are listed on the menu, choose the lower calorie options.  Choose dishes that include vegetables, fruits, whole grains, low-fat dairy products, and lean protein.  Choose items that are boiled, broiled, grilled, or steamed. Stay away from items that are buttered, battered, fried, or served with cream sauce. Items labeled "crispy" are usually fried, unless stated otherwise.  Choose water, low-fat milk, unsweetened iced tea, or other drinks without added sugar. If you want an alcoholic beverage, choose a lower calorie option such as a glass of wine or light beer.  Ask for dressings, sauces, and syrups on the side. These are usually high in calories, so you should limit the amount you eat.  If you want a salad, choose a garden salad and ask for grilled meats. Avoid extra toppings like bacon, cheese, or fried items. Ask for the dressing on the side, or ask for olive oil and vinegar or lemon to use as dressing.  Estimate how many servings of a food you are given. For example, a serving of cooked rice is  cup or about the size of half a baseball. Knowing serving sizes will help you be aware of how much food you are eating at restaurants. The list below tells you how big or small some common portion sizes are based on everyday objects: ? 1 oz-4 stacked dice. ? 3 oz-1 deck of cards. ? 1 tsp-1 die. ? 1 Tbsp- a ping-pong ball. ? 2 Tbsp-1 ping-pong ball. ?  cup-  baseball. ? 1 cup-1 baseball. Summary  Calorie counting means keeping track of how many calories you eat and drink each day. If you eat fewer calories than your body needs, you should lose weight.  A healthy amount of weight to lose per week is usually 1-2 lb (0.5-0.9 kg). This usually means reducing your daily calorie intake by 500-750 calories.  The number of calories in a food can be found on a Nutrition Facts label. If a food does not have a Nutrition Facts label, try to look up the calories online or ask your dietitian for help.  Use your calories on foods and drinks that will fill you up, and not on foods and drinks that will leave you hungry.  Use smaller plates, glasses, and bowls to prevent overeating. This information is not intended to replace advice given to you by your health care provider. Make sure you discuss any questions you have with your health care provider. Document Released: 04/19/2005 Document Revised: 03/19/2016 Document Reviewed: 03/19/2016 Elsevier Interactive Patient Education  2017 [employer].

## 2017-01-19 NOTE — Progress Notes (Signed)
Subjective:    Patient ID: Peter Rivera, male    DOB: 08-06-1980, 36 y.o.   MRN: [id number]  Patient presents today for complete physical  HPI  denies any acute complains today  HTN: BP not at goal with amlodipine, and irbesartan. 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 BP Readings from Last 3 Encounters:  01/19/17 (!) 132/92  12/20/16 (!) 140/94  11/04/16 (!) 134/94   Immunizations: (TDAP, Hep C screen, Pneumovax, Influenza, zoster)  Health Maintenance  Topic Date Due  . HIV Screening  04/10/1995  . Tetanus Vaccine  09/21/2020  . Flu Shot  Completed   Diet: regular.  Weight:  Wt Readings from Last 3 Encounters:  01/19/17 253 lb (114.8 kg)  12/20/16 255 lb 9.6 oz (115.9 kg)  11/04/16 250 lb (113.4 kg)    Exercise:none.  Fall Risk: Fall Risk  01/19/2017 01/19/2016 01/08/2015 01/16/2014  Falls in the past year? No No No No   Home Safety: out of state [employer], use of seat belt, fire alarm at home.  Depression/Suicide: Depression screen [facility] 2/9 01/19/2017 01/19/2016 01/08/2015 01/16/2014  Decreased Interest 0 0 0 0  Down, Depressed, Hopeless 0 0 0 0  PHQ - 2 Score 0 0 0 0   Vision:up to date.  Dental:up to date  Medications and allergies reviewed with patient and updated if appropriate.  Patient Active Problem List   Diagnosis Date Noted  . Obesity, morbid (HCC) 01/19/2016  . Plantar fasciitis 03/31/2015  . Abdominal pain 01/08/2015  . Allergic rhinitis 01/08/2015  . Gout 09/17/2011  . Impaired glucose tolerance 03/29/2011  . Hypertriglyceridemia 12/29/2010  . HTN (hypertension) 09/22/2010  . Preventative health care 09/22/2010    Current Outpatient Prescriptions on File Prior to Visit  Medication Sig Dispense Refill  . EPINEPHrine (EPIPEN 2-PAK) 0.3 mg/0.3 mL IJ SOAJ injection Inject 0.3 mLs (0.3 mg total) into the muscle once. 2 Device 1   No current facility-administered medications on  file prior to visit.     Past Medical History:  Diagnosis Date  . 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 HTN (hypertension) 09/22/2010  . Hypertension   . Hypertriglyceridemia 12/29/2010  . Impaired glucose tolerance 03/29/2011    No past surgical history on file.  Social History   Social History  . Marital status: Married    Spouse name: N/A  . Number of children: 1  . Years of education: 66   Occupational History  . truck driver    Social History Main Topics  . Smoking status: Never Smoker  . Smokeless tobacco: Never Used  . Alcohol use 0.0 oz/week     Comment: occasional  . Drug use: No  . Sexual activity: Not Asked   Other Topics Concern  . None   Social History Narrative  . None    Family History  Problem Relation Age of Onset  . Diabetes Paternal Grandmother   . Healthy Mother   . Healthy Father   . Diabetes Maternal Grandfather         Review of Systems  Constitutional: Negative for fever, malaise/fatigue and weight loss.  HENT: Negative for congestion and sore throat.   Eyes:       Negative for visual changes  Respiratory: Negative for cough and shortness of breath.   Cardiovascular: Negative for chest pain, palpitations and leg swelling.  Gastrointestinal: Negative for abdominal pain, blood in stool, constipation, diarrhea, heartburn and  melena.  Genitourinary: Negative for dysuria, frequency and urgency.  Musculoskeletal: Negative for falls, joint pain and myalgias.  Skin: Negative for rash.  Neurological: Negative for dizziness, sensory change and headaches.  Endo/Heme/Allergies: Does not bruise/bleed easily.  Psychiatric/Behavioral: Negative for depression, substance abuse and suicidal ideas. The patient is not nervous/anxious.     Objective:   Vitals:   01/19/17 1313  BP: (!) 132/92  Pulse: (!) 101  Temp: 97.7 F (36.5 C)  SpO2: 98%    Body mass index is 43.43 kg/m.   Physical Examination:  Physical Exam  Constitutional: He  is oriented to person, place, and time and well-developed, well-nourished, and in no distress. No distress.  HENT:  Right Ear: External ear normal.  Left Ear: External ear normal.  Nose: Nose normal.  Mouth/Throat: Oropharynx is clear and moist. No oropharyngeal exudate.  Eyes: Pupils are equal, round, and reactive to light. Conjunctivae and EOM are normal. No scleral icterus.  Neck: Normal range of motion. Neck supple. No thyromegaly present.  Cardiovascular: Normal rate, normal heart sounds and intact distal pulses.   Pulmonary/Chest: Effort normal and breath sounds normal. He exhibits no tenderness.  Abdominal: Soft. Bowel sounds are normal. He exhibits no distension. There is no tenderness.  Musculoskeletal: Normal range of motion. He exhibits no edema or tenderness.  Lymphadenopathy:    He has no cervical adenopathy.  Neurological: He is alert and oriented to person, place, and time. Gait normal.  Skin: Skin is warm and dry.  Psychiatric: Affect and judgment normal.    ASSESSMENT and PLAN:  Shahiem was seen today for annual exam.  Diagnoses and all orders for this visit:  Encounter for preventative adult health care exam with abnormal findings -     CBC with Differential/Platelet; Future -     TSH; Future  Essential hypertension -     Basic metabolic panel; Future  Impaired glucose tolerance -     Hemoglobin A1c; Future  Hypertriglyceridemia -     Lipid panel; Future -     Hepatic function panel; Future  Chronic gout without tophus, unspecified cause, unspecified site -     Uric acid; Future  Need for influenza vaccination -     Flu Vaccine QUAD 36+ mos IM    No problem-specific Assessment & Plan notes found for this encounter.      Follow up: Return in about 6 months (around 07/19/2017) for with Tammy Sours calone.  Alysia Penna, NP

## 2017-01-20 ENCOUNTER — Other Ambulatory Visit: Payer: Self-pay | Admitting: Nurse Practitioner

## 2017-01-20 DIAGNOSIS — M1A9XX0 Chronic gout, unspecified, without tophus (tophi): Secondary | ICD-10-CM

## 2017-01-20 DIAGNOSIS — I10 Essential (primary) hypertension: Secondary | ICD-10-CM

## 2017-01-20 MED ORDER — ALLOPURINOL 100 MG PO TABS: 200.0000 mg | ORAL_TABLET | Freq: Every day | ORAL | 5 refills | Status: DC

## 2017-01-20 MED ORDER — AMLODIPINE BESYLATE 10 MG PO TABS: 10.0000 mg | ORAL_TABLET | Freq: Every day | ORAL | 1 refills | Status: DC

## 2017-01-20 MED ORDER — IRBESARTAN 300 MG PO TABS: 300.0000 mg | ORAL_TABLET | Freq: Every day | ORAL | 1 refills | Status: DC

## 2017-01-21 ENCOUNTER — Telehealth: Payer: Self-pay | Admitting: Family

## 2017-01-21 NOTE — Telephone Encounter (Signed)
Pt called in and said that he would like for [place] nurse to call him.  He is wanting to pick up paper work on Saturday, told him he would have to pick it up mon-Friday.  We could not leave it for Saturday

## 2017-01-21 NOTE — Telephone Encounter (Signed)
Spoke with pt, he is aware to come sign, we will fax the from once sign, gave him his copy M-F 8-5. Paper work is at [employer].

## 2017-01-24 ENCOUNTER — Encounter: Payer: 59 | Admitting: Nurse Practitioner

## 2017-07-16 ENCOUNTER — Other Ambulatory Visit: Payer: Self-pay | Admitting: Nurse Practitioner

## 2017-07-16 DIAGNOSIS — I10 Essential (primary) hypertension: Secondary | ICD-10-CM

## 2017-07-16 DIAGNOSIS — M1A9XX0 Chronic gout, unspecified, without tophus (tophi): Secondary | ICD-10-CM

## 2017-07-18 ENCOUNTER — Telehealth: Payer: Self-pay | Admitting: Nurse Practitioner

## 2017-07-18 ENCOUNTER — Telehealth: Payer: Self-pay | Admitting: Family

## 2017-07-18 DIAGNOSIS — I10 Essential (primary) hypertension: Secondary | ICD-10-CM

## 2017-07-18 MED ORDER — OLMESARTAN MEDOXOMIL 40 MG PO TABS: 40.0000 mg | ORAL_TABLET | Freq: Every day | ORAL | 0 refills | Status: DC

## 2017-07-18 NOTE — Telephone Encounter (Signed)
This medication has been on back order for 2 months. Patient is requesting different med be phoned in, please. LOV 01/19/17 PCP Alysia Pennaharlotte Nche, NP Pharmacy on file-[facility],[place]

## 2017-07-18 NOTE — Telephone Encounter (Signed)
Pt is aware. TLG

## 2017-07-18 NOTE — Telephone Encounter (Signed)
Copied from CRM [id number]. Topic: Quick Communication - Rx Refill/Question >> Jul 18, 2017  8:20 AM Cipriano BunkerLambe, Annette S wrote: Medication:  irbesartan (AVAPRO) 300 MG tablet  -  back order    Has the patient contacted their pharmacy? Yes.     (Agent: If no, request that the patient contact the pharmacy for the refill.)   Preferred Pharm[facility] (with phone number or street name[facility] [address], [place] - [id number] [facility] [address] [place] [address] Phone: [phone] Fax: [phone]     Agent: Please be advised that RX refills may take up to 3 business days. We ask that you follow-up with your pharmacy.

## 2017-07-18 NOTE — Telephone Encounter (Signed)
Copied from CRM [id number]. Topic: Quick Communication - Rx Refill/Question >> Jul 18, 2017  8:23 AM Cipriano BunkerLambe, Annette S wrote:  Medication:  irbesartan (AVAPRO) 300 MG tablet  -  back order for several months -  Please call in a different prescription This needs to go to Dr. Elease [place]Nche in [place]   Has the patient contacted their pharmacy? Yes.     (Agent: If no, request that the patient contact the pharmacy for the refill.)   Preferred Pharmacy (with phone number or street name): [facility] [address] [place] - [id number] [facility] [address] [place] [address] Phone: [phone] Fax: [phone]     Agent: Please be advised that RX refills may take up to 3 business days. We ask that you follow-up with your pharmacy.

## 2017-07-22 ENCOUNTER — Telehealth: Payer: Self-pay | Admitting: Nurse Practitioner

## 2017-07-22 NOTE — Telephone Encounter (Signed)
Spoke with Kenney Housemananya. Verbalized understand that the pt needs to make an appt with Nche for BP and med evaluation.

## 2017-07-22 NOTE — Telephone Encounter (Signed)
Spouse is requesting office to call patient.  States fertility specialist does not want patient to take any calcium blockers.  Please follow up in regard.

## 2017-08-01 ENCOUNTER — Other Ambulatory Visit (INDEPENDENT_AMBULATORY_CARE_PROVIDER_SITE_OTHER): Payer: No Typology Code available for payment source

## 2017-08-01 ENCOUNTER — Encounter: Payer: Self-pay | Admitting: Internal Medicine

## 2017-08-01 ENCOUNTER — Ambulatory Visit (INDEPENDENT_AMBULATORY_CARE_PROVIDER_SITE_OTHER): Payer: No Typology Code available for payment source | Admitting: Internal Medicine

## 2017-08-01 ENCOUNTER — Telehealth: Payer: Self-pay | Admitting: Internal Medicine

## 2017-08-01 VITALS — BP 138/88 | HR 103 | Temp 98.7°F | Ht 64.0 in | Wt 253.0 lb

## 2017-08-01 DIAGNOSIS — R7302 Impaired glucose tolerance (oral): Secondary | ICD-10-CM

## 2017-08-01 DIAGNOSIS — Z0000 Encounter for general adult medical examination without abnormal findings: Secondary | ICD-10-CM

## 2017-08-01 DIAGNOSIS — Z114 Encounter for screening for human immunodeficiency virus [HIV]: Secondary | ICD-10-CM

## 2017-08-01 DIAGNOSIS — I10 Essential (primary) hypertension: Secondary | ICD-10-CM | POA: Diagnosis not present

## 2017-08-01 LAB — URINALYSIS, ROUTINE W REFLEX MICROSCOPIC
Bilirubin Urine: NEGATIVE
Hgb urine dipstick: NEGATIVE
Ketones, ur: NEGATIVE
Leukocytes, UA: NEGATIVE
NITRITE: NEGATIVE
PH: 7 (ref 5.0–8.0)
Specific Gravity, Urine: 1.02 (ref 1.000–1.030)
TOTAL PROTEIN, URINE-UPE24: NEGATIVE
Urine Glucose: NEGATIVE
Urobilinogen, UA: 0.2 (ref 0.0–1.0)

## 2017-08-01 LAB — CBC WITH DIFFERENTIAL/PLATELET
BASOS PCT: 0.6 % (ref 0.0–3.0)
Basophils Absolute: 0 10*3/uL (ref 0.0–0.1)
EOS PCT: 1.5 % (ref 0.0–5.0)
Eosinophils Absolute: 0.1 10*3/uL (ref 0.0–0.7)
HEMATOCRIT: 44.5 % (ref 39.0–52.0)
Hemoglobin: 14.6 g/dL (ref 13.0–17.0)
LYMPHS PCT: 27.1 % (ref 12.0–46.0)
Lymphs Abs: 2 10*3/uL (ref 0.7–4.0)
MCHC: 32.8 g/dL (ref 30.0–36.0)
MCV: 76.3 fl — AB (ref 78.0–100.0)
MONOS PCT: 8.7 % (ref 3.0–12.0)
Monocytes Absolute: 0.6 10*3/uL (ref 0.1–1.0)
NEUTROS ABS: 4.6 10*3/uL (ref 1.4–7.7)
NEUTROS PCT: 62.1 % (ref 43.0–77.0)
PLATELETS: 343 10*3/uL (ref 150.0–400.0)
RBC: 5.83 Mil/uL — ABNORMAL HIGH (ref 4.22–5.81)
RDW: 15 % (ref 11.5–15.5)
WBC: 7.5 10*3/uL (ref 4.0–10.5)

## 2017-08-01 LAB — BASIC METABOLIC PANEL
BUN: 15 mg/dL (ref 6–23)
CHLORIDE: 101 meq/L (ref 96–112)
CO2: 30 mEq/L (ref 19–32)
CREATININE: 1.13 mg/dL (ref 0.40–1.50)
Calcium: 9.8 mg/dL (ref 8.4–10.5)
GFR: 93.75 mL/min (ref >60.00)
Glucose, Bld: 106 mg/dL — ABNORMAL HIGH (ref 70–99)
POTASSIUM: 4.1 meq/L (ref 3.5–5.1)
SODIUM: 140 meq/L (ref 135–145)

## 2017-08-01 LAB — HEPATIC FUNCTION PANEL
ALBUMIN: 4.1 g/dL (ref 3.5–5.2)
ALK PHOS: 106 U/L (ref 39–117)
ALT: 32 U/L (ref 0–53)
AST: 23 U/L (ref 0–37)
Bilirubin, Direct: 0.1 mg/dL (ref 0.0–0.3)
TOTAL PROTEIN: 8.2 g/dL (ref 6.0–8.3)
Total Bilirubin: 0.3 mg/dL (ref 0.2–1.2)

## 2017-08-01 LAB — LIPID PANEL
CHOLESTEROL: 150 mg/dL (ref 0–200)
HDL: 40.4 mg/dL (ref >39.00)
LDL CALC: 72 mg/dL (ref 0–99)
NonHDL: 109.58
TRIGLYCERIDES: 189 mg/dL — AB (ref 0.0–149.0)
Total CHOL/HDL Ratio: 4
VLDL: 37.8 mg/dL (ref 0.0–40.0)

## 2017-08-01 LAB — TSH: TSH: 0.65 u[IU]/mL (ref 0.35–4.50)

## 2017-08-01 MED ORDER — METOPROLOL SUCCINATE ER 50 MG PO TB24: 50.0000 mg | ORAL_TABLET | Freq: Every day | ORAL | 3 refills | Status: DC

## 2017-08-01 MED ORDER — EPINEPHRINE 0.3 MG/0.3ML IJ SOAJ: 0.3000 mg | Freq: Once | INTRAMUSCULAR | 1 refills | Status: AC

## 2017-08-01 NOTE — Telephone Encounter (Signed)
Please undate patients pharmacy to [facility].  I have requested [facility] to transfer the two scripts sent today to [facility].  Patient thought both pharmacies were one in the same.

## 2017-08-01 NOTE — Telephone Encounter (Signed)
Done

## 2017-08-01 NOTE — Progress Notes (Signed)
Subjective:    Patient ID: Peter Rivera, male    DOB: 03/26/1981, 37 y.o.   MRN: [id number]  HPI  Here for wellness and f/u;  Overall doing ok;  Pt denies Chest pain, worsening SOB, DOE, wheezing, orthopnea, PND, worsening LE edema, palpitations, dizziness or syncope.  Pt denies neurological change such as new headache, facial or extremity weakness.  Pt denies polydipsia, polyuria, or low sugar symptoms. Pt states overall good compliance with treatment and medications, good tolerability, and has been trying to follow appropriate diet.  Pt denies worsening depressive symptoms, suicidal ideation or panic. No fever, night sweats, wt loss, loss of appetite, or other constitutional symptoms.  Pt states good ability with ADL's, has low fall risk, home safety reviewed and adequate, no other significant changes in hearing or vision, and only occasionally active with exercise. 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  Has seen infertility with wife, and he is advised to change the amolodipine.   Past Medical History:  Diagnosis Date  . 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 HTN (hypertension) 09/22/2010  . Hypertension   . Hypertriglyceridemia 12/29/2010  . Impaired glucose tolerance 03/29/2011   No past surgical history on file.  reports that he has never smoked. He has never used smokeless tobacco. He reports that he drinks alcohol. He reports that he does not use drugs. family history includes Diabetes in his maternal grandfather and paternal grandmother; Healthy in his father and mother. No Known Allergies Current Outpatient Medications on File Prior to Visit  Medication Sig Dispense Refill  . allopurinol (ZYLOPRIM) 100 MG tablet Take 2 tablets (200 mg total) by mouth daily. Need office visit for additional refills 60 tablet 0  . olmesartan (BENICAR) 40 MG tablet Take  1 tablet (40 mg total) by mouth daily. Need office visit for additional refills 30 tablet 0   No current facility-administered medications on file prior to visit.    Review of Systems Constitutional: Negative for other unusual diaphoresis, sweats, appetite or weight changes HENT: Negative for other worsening hearing loss, ear pain, facial swelling, mouth sores or neck stiffness.   Eyes: Negative for other worsening pain, redness or other visual disturbance.  Respiratory: Negative for other stridor or swelling Cardiovascular: Negative for other palpitations or other chest pain  Gastrointestinal: Negative for worsening diarrhea or loose stools, blood in stool, distention or other pain Genitourinary: Negative for hematuria, flank pain or other change in urine volume.  Musculoskeletal: Negative for myalgias or other joint swelling.  Skin: Negative for other color change, or other wound or worsening drainage.  Neurological: Negative for other syncope or numbness. Hematological: Negative for other adenopathy or swelling Psychiatric/Behavioral: Negative for hallucinations, other worsening agitation, SI, self-injury, or new decreased concentration All other system neg per pt    Objective:   Physical Exam BP 138/88   Pulse (!) 103   Temp 98.7 F (37.1 C) (Oral)   Ht 5\' 4"  (1.626 m)   Wt 253 lb (114.8 kg)   SpO2 96%   BMI 43.43 kg/m  VS noted,  Constitutional: Pt is oriented to person, place, and time. Appears well-developed and well-nourished, in no significant distress and comfortable Head: Normocephalic and atraumatic  Eyes: Conjunctivae and EOM are normal. Pupils are equal, round, and reactive to light Right Ear: External ear normal without discharge Left  Ear: External ear normal without discharge Nose: Nose without discharge or deformity Mouth/Throat: Oropharynx is without other ulcerations and moist  Neck: Normal range of motion. Neck supple. No JVD present. No tracheal deviation  present or significant neck LA or mass Cardiovascular: Normal rate, regular rhythm, normal heart sounds and intact distal pulses  Pulmonary/Chest: WOB normal and breath sounds without rales or wheezing  Abdominal: Soft. Bowel sounds are normal. NT. No HSM  Musculoskeletal: Normal range of motion. Exhibits no edema Lymphadenopathy: Has no other cervical adenopathy.  Neurological: Pt is alert and oriented to person, place, and time. Pt has normal reflexes. No cranial nerve deficit. Motor grossly intact, Gait intact Skin: Skin is warm and dry. No rash noted or new ulcerations Psychiatric:  Has normal mood and affect. Behavior is normal without agitation No other exam findings       Assessment & Plan:

## 2017-08-01 NOTE — Patient Instructions (Addendum)
Ok to stop the amlodipine at your request  Please take all new medication as prescribed - the toprol XL 50 mg per day  - NOTE  - this may not be enough, so be sure check your Blood Pressure at home and let us know in 2-3 wks if your BP is more than 140/90  Please continue all other medications as before, and refills have been done if requested - the epipen  Please have the pharmacy call with any other refills you may need.  Please continue your efforts at being more active, low cholesterol diet, and weight control.  You are otherwise up to date with prevention measures today.  Please keep your appointments with your specialists as you may have planned  Please go to the LAB in the Basement (turn left off the elevator) for the tests to be done today  You will be contacted by phone if any changes need to be made immediately.  Otherwise, you will receive a letter about your results with an explanation, but please check with MyChart first.  Please remember to sign up for MyChart if you have not done so, as this will be important to you in the future with finding out test results, communicating by private email, and scheduling acute appointments online when needed.  Please return in 1 year for your yearly visit, or sooner if needed, with Lab testing done 3-5 days before

## 2017-08-02 LAB — HIV ANTIBODY (ROUTINE TESTING W REFLEX): HIV 1&2 Ab, 4th Generation: NONREACTIVE

## 2017-08-02 NOTE — Assessment & Plan Note (Signed)
stable overall by history and exam, recent data reviewed with pt, and pt to continue medical treatment as before,  to f/u any worsening symptoms or concerns  

## 2017-08-02 NOTE — Assessment & Plan Note (Signed)
Per pt reqeust, ok to change amlodipine to toprol XL 50 qd,  to f/u BP at home and call for < 140/90

## 2017-08-02 NOTE — Assessment & Plan Note (Signed)

## 2017-08-15 MED FILL — EPINEPHRINE 0.3 MG AUTO-INJ: 0.3 | 30 days supply | Qty: 2 | Fill #0

## 2017-08-24 MED FILL — METOPROLOL SUCC ER 50 MG TA: 50 | 90 days supply | Qty: 90 | Fill #0

## 2017-08-24 MED FILL — IRBESARTAN 300 MG TABLET: 300 | 30 days supply | Qty: 30 | Fill #0

## 2017-08-24 MED FILL — ALLOPURINOL 100 MG TABS: 100 | 30 days supply | Qty: 60 | Fill #0

## 2017-10-13 ENCOUNTER — Other Ambulatory Visit: Payer: Self-pay | Admitting: Nurse Practitioner

## 2017-10-13 MED FILL — IRBESARTAN 300 MG TAB: 300 | 30 days supply | Qty: 30 | Fill #0

## 2017-11-24 ENCOUNTER — Other Ambulatory Visit: Payer: Self-pay | Admitting: Family Medicine

## 2017-11-24 NOTE — Telephone Encounter (Signed)
Please see refill request. Thanks TLG 

## 2017-11-25 MED ORDER — LOSARTAN POTASSIUM 100 MG PO TABS: 100.0000 mg | ORAL_TABLET | Freq: Every day | ORAL | 3 refills | Status: DC

## 2017-11-25 MED FILL — LOSARTAN POTASSIUM 100 MG T: 100 | 90 days supply | Qty: 90 | Fill #0

## 2017-11-25 NOTE — Telephone Encounter (Signed)
For some reason pt has had Avapro rx from June 13 and Benicar prescription remains listed as prior prescribed (pt would not need both)  Avapro has been on backorder and likely is not available  So -   1) d/c avapro 2) d/c benicar if not done already 3)  Start losartan 100 qd  Shirron to contact pt to let him know, thanks

## 2017-12-14 MED FILL — EPINEPHRINE 0.3 MG AUTO-INJ: 0.3 | 30 days supply | Qty: 2 | Fill #1

## 2017-12-14 MED FILL — METOPROLOL SUCCINATE ER 50: 50 | 90 days supply | Qty: 90 | Fill #1

## 2017-12-17 ENCOUNTER — Encounter: Payer: Self-pay | Admitting: Internal Medicine

## 2018-01-26 ENCOUNTER — Telehealth: Payer: Self-pay | Admitting: Internal Medicine

## 2018-01-26 MED ORDER — AMLODIPINE BESYLATE 10 MG PO TABS: 10.0000 mg | ORAL_TABLET | Freq: Every day | ORAL | 3 refills | Status: DC

## 2018-01-26 MED FILL — AMLODIPINE BESYLATE 10 MG T: 10 | 90 days supply | Qty: 90 | Fill #0

## 2018-01-26 NOTE — Telephone Encounter (Signed)
Ok to add amlodipine 10 qd  Continue all other meds  Will need f/u Mon as planned

## 2018-01-26 NOTE — Telephone Encounter (Signed)
Patient is currently on the road.  He will be back in town on Monday.  I have scheduled patient for Monday to be seen.  His BP as of yesterday was 175/114.  Patient last week had headaches and was vomiting.   Patient did an Evisit and was told to go to the ED.  Patient has not gone to the ED.  I have advised for patient to go to ED or Urgent Care if he is still having symptoms.  I am sending this message in case Dr. Jonny Ruiz wants to advise patient to change medications in anyway.

## 2018-01-27 NOTE — Telephone Encounter (Signed)
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 Inform wife to have husband to keep appt for Monday am, but if he start having additional sxs over the weekend he need to go to ED for evaluation.Marland KitchenIshmael Rivera

## 2018-01-27 NOTE — Telephone Encounter (Signed)
Called pt spoke w/wife gave MD response. Wife states he can not take the amlodipine. He had tried to take in the past. He can't take a beta-blocker. She states they are trying to have a baby and when he takes a beta blocker it does something to his sperm. Requesting an alternative.Marland KitchenRaechel Chute

## 2018-01-27 NOTE — Telephone Encounter (Signed)
Please make OV.  thanks

## 2018-01-30 ENCOUNTER — Ambulatory Visit (INDEPENDENT_AMBULATORY_CARE_PROVIDER_SITE_OTHER): Payer: No Typology Code available for payment source | Admitting: Internal Medicine

## 2018-01-30 ENCOUNTER — Encounter: Payer: Self-pay | Admitting: Internal Medicine

## 2018-01-30 VITALS — BP 126/84 | HR 95 | Temp 97.7°F | Ht 64.0 in | Wt 256.0 lb

## 2018-01-30 DIAGNOSIS — Z23 Encounter for immunization: Secondary | ICD-10-CM | POA: Diagnosis not present

## 2018-01-30 DIAGNOSIS — R7302 Impaired glucose tolerance (oral): Secondary | ICD-10-CM

## 2018-01-30 DIAGNOSIS — E781 Pure hyperglyceridemia: Secondary | ICD-10-CM | POA: Diagnosis not present

## 2018-01-30 DIAGNOSIS — I10 Essential (primary) hypertension: Secondary | ICD-10-CM

## 2018-01-30 MED ORDER — HYDROCHLOROTHIAZIDE 25 MG PO TABS: 25.0000 mg | ORAL_TABLET | Freq: Every day | ORAL | 3 refills | Status: DC

## 2018-01-30 MED FILL — HYDROCHLOROTHIAZIDE 25 MG T: 25 | 90 days supply | Qty: 90 | Fill #0

## 2018-01-30 NOTE — Assessment & Plan Note (Signed)
Uncontrolled, for add hct 25 qd, consider increased BB if not improved in 2-3 wks

## 2018-01-30 NOTE — Patient Instructions (Addendum)
Please take all new medication as prescribed - the fluid pill (HCT 25 mg per day)  Please continue all other medications as before, and refills have been done if requested.  Please call if your blood pressure is more than 130/80 in 2 weeks, and we can consider increasing the metoprolol  Please have the pharmacy call with any other refills you may need.  Please continue your efforts at being more active, low cholesterol diet, and weight control.  Please keep your appointments with your specialists as you may have planned

## 2018-01-30 NOTE — Assessment & Plan Note (Signed)
Lab Results  Component Value Date   CHOL 150 08/01/2017   HDL 40.40 08/01/2017   LDLCALC 72 08/01/2017   LDLDIRECT 68.7 01/12/2013   TRIG 189.0 (H) 08/01/2017   CHOLHDL 4 08/01/2017  stable overall by history and exam, recent data reviewed with pt, and pt to continue medical treatment as before,  to f/u any worsening symptoms or concerns

## 2018-01-30 NOTE — Progress Notes (Signed)
Subjective:    Patient ID: Peter Rivera, male    DOB: Jul 19, 1980, 37 y.o.   MRN: [id number]  HPI  Here to f/u; overall doing ok,  Pt denies chest pain, increasing sob or doe, wheezing, orthopnea, PND, increased LE swelling, palpitations, dizziness or syncope.  Pt denies new neurological symptoms such as new headache, or facial or extremity weakness or numbness.  Pt denies polydipsia, polyuria, or low sugar episode.  Pt states overall good compliance with meds, mostly trying to follow appropriate diet, with wt overall stable but increased. C/o intermittent HA, Bp high at home with wrist cuff; has been 170's/124 but today 148/107.  Not taking the amlodipine as fertility provider said this would reduce his fertility BP Readings from Last 3 Encounters:  01/30/18 126/84  08/01/17 138/88  01/19/17 (!) 132/92   Past Medical History:  Diagnosis Date  . 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 HTN (hypertension) 09/22/2010  . Hypertension   . Hypertriglyceridemia 12/29/2010  . Impaired glucose tolerance 03/29/2011   No past surgical history on file.  reports that he has never smoked. He has never used smokeless tobacco. He reports that he drinks alcohol. He reports that he does not use drugs. family history includes Diabetes in his maternal grandfather and paternal grandmother; Healthy in his father and mother. No Known Allergies Current Outpatient Medications on File Prior to Visit  Medication Sig Dispense Refill  . allopurinol (ZYLOPRIM) 100 MG tablet Take 2 tablets (200 mg total) by mouth daily. Need office visit for additional refills 60 tablet 0  . losartan (COZAAR) 100 MG tablet Take 1 tablet (100 mg total) by mouth daily. 90 tablet 3  . metoprolol succinate (TOPROL-XL) 50 MG 24 hr tablet Take 1 tablet (50 mg total) by mouth daily. Take with or immediately following a meal. 90 tablet 3   No current facility-administered medications on file prior to visit.    Review of Systems  Constitutional: Negative for other unusual diaphoresis or sweats HENT: Negative for ear discharge or swelling Eyes: Negative for other worsening visual disturbances Respiratory: Negative for stridor or other swelling  Gastrointestinal: Negative for worsening distension or other blood Genitourinary: Negative for retention or other urinary change Musculoskeletal: Negative for other MSK pain or swelling Skin: Negative for color change or other new lesions Neurological: Negative for worsening tremors and other numbness  Psychiatric/Behavioral: Negative for worsening agitation or other fatigue All other system neg per pt    Objective:   Physical Exam BP 126/84   Pulse 95   Temp 97.7 F (36.5 C) (Oral)   Ht 5\' 4"  (1.626 m)   Wt 256 lb (116.1 kg)   SpO2 97%   BMI 43.94 kg/m  VS noted, repeat BP left arm large cuff - 142/116 Constitutional: Pt appears in NAD HENT: Head: NCAT.  Right Ear: External ear normal.  Left Ear: External ear normal.  Eyes: . Pupils are equal, round, and reactive to light. Conjunctivae and EOM are normal Nose: without d/c or deformity Neck: Neck supple. Gross normal ROM Cardiovascular: Normal rate and regular rhythm.   Pulmonary/Chest: Effort normal and breath sounds without rales or wheezing.  Abd:  Soft, NT, ND, + BS, no organomegaly Neurological: Pt is alert. At baseline orientation, motor grossly intact Skin: Skin is warm. No rashes, other new lesions, no LE edema Psychiatric: Pt behavior is normal without agitation  No other exam findings Lab Results  Component Value Date   WBC 7.5 08/01/2017   HGB 14.6 08/01/2017  HCT 44.5 08/01/2017   PLT 343.0 08/01/2017   GLUCOSE 106 (H) 08/01/2017   CHOL 150 08/01/2017   TRIG 189.0 (H) 08/01/2017   HDL 40.40 08/01/2017   LDLDIRECT 68.7 01/12/2013   LDLCALC 72 08/01/2017   ALT 32 08/01/2017   AST 23 08/01/2017   NA 140 08/01/2017   K 4.1 08/01/2017   CL 101 08/01/2017   CREATININE 1.13 08/01/2017   BUN  15 08/01/2017   CO2 30 08/01/2017   TSH 0.65 08/01/2017   HGBA1C 5.8 01/19/2017       Assessment & Plan:

## 2018-01-30 NOTE — Assessment & Plan Note (Signed)
stable overall by history and exam, recent data reviewed with pt, and pt to continue medical treatment as before,  to f/u any worsening symptoms or concerns Lab Results  Component Value Date   HGBA1C 5.8 01/19/2017   

## 2018-02-09 ENCOUNTER — Other Ambulatory Visit: Payer: Self-pay | Admitting: Nurse Practitioner

## 2018-02-09 DIAGNOSIS — M1A9XX0 Chronic gout, unspecified, without tophus (tophi): Secondary | ICD-10-CM

## 2018-02-13 ENCOUNTER — Other Ambulatory Visit: Payer: Self-pay | Admitting: Nurse Practitioner

## 2018-02-13 DIAGNOSIS — M1A9XX0 Chronic gout, unspecified, without tophus (tophi): Secondary | ICD-10-CM

## 2018-02-16 ENCOUNTER — Telehealth: Payer: Self-pay | Admitting: Internal Medicine

## 2018-02-16 MED FILL — ALLOPURINOL 100 MG TABLET: 100 | 30 days supply | Qty: 60 | Fill #0

## 2018-02-16 NOTE — Telephone Encounter (Signed)
Copied from CRM [id number]. Topic: Quick Communication - See Telephone Encounter >> Feb 16, 2018 10:06 AM Trula Slade wrote: CRM for notification. See Telephone encounter for: 02/16/18. Maxine Glenn w/[facility] [phone] would like to know if the patient was taken off of Amlodipine 10mg  medication.

## 2018-02-16 NOTE — Telephone Encounter (Signed)
yes

## 2018-03-03 MED FILL — LOSARTAN POTASSIUM 100 MG T: 100 | 30 days supply | Qty: 30 | Fill #1

## 2018-03-29 MED FILL — METOPROLOL SUCCINATE ER 50: 50 | 90 days supply | Qty: 90 | Fill #2

## 2018-04-10 ENCOUNTER — Other Ambulatory Visit: Payer: Self-pay | Admitting: Internal Medicine

## 2018-04-10 DIAGNOSIS — M1A9XX0 Chronic gout, unspecified, without tophus (tophi): Secondary | ICD-10-CM

## 2018-04-10 MED FILL — ALLOPURINOL 100 MG TABS: 100 | 30 days supply | Qty: 60 | Fill #0

## 2018-04-10 MED FILL — LOSARTAN POTASSIUM 100 MG T: 100 | 30 days supply | Qty: 30 | Fill #2

## 2018-05-08 ENCOUNTER — Encounter: Payer: Self-pay | Admitting: Internal Medicine

## 2018-05-08 ENCOUNTER — Ambulatory Visit (INDEPENDENT_AMBULATORY_CARE_PROVIDER_SITE_OTHER): Payer: No Typology Code available for payment source | Admitting: Internal Medicine

## 2018-05-08 VITALS — BP 152/102 | HR 109 | Temp 97.9°F | Ht 64.0 in | Wt 265.0 lb

## 2018-05-08 DIAGNOSIS — M109 Gout, unspecified: Secondary | ICD-10-CM

## 2018-05-08 DIAGNOSIS — E781 Pure hyperglyceridemia: Secondary | ICD-10-CM | POA: Diagnosis not present

## 2018-05-08 DIAGNOSIS — R7302 Impaired glucose tolerance (oral): Secondary | ICD-10-CM | POA: Diagnosis not present

## 2018-05-08 DIAGNOSIS — I10 Essential (primary) hypertension: Secondary | ICD-10-CM | POA: Diagnosis not present

## 2018-05-08 MED ORDER — METOPROLOL SUCCINATE ER 100 MG PO TB24: 100.0000 mg | ORAL_TABLET | Freq: Every day | ORAL | 3 refills | Status: DC

## 2018-05-08 MED FILL — HYDROCHLOROTHIAZIDE 25 MG T: 25 | 90 days supply | Qty: 90 | Fill #1

## 2018-05-08 MED FILL — METOPROLOL SUCCINATE ER 100: 100 | 90 days supply | Qty: 90 | Fill #0

## 2018-05-08 NOTE — Assessment & Plan Note (Signed)
stable overall by history and exam, recent data reviewed with pt, and pt to continue medical treatment as before,  to f/u any worsening symptoms or concerns  

## 2018-05-08 NOTE — Assessment & Plan Note (Signed)
Mild uncontrolled, for increased toprol Xl 100 qd

## 2018-05-08 NOTE — Assessment & Plan Note (Signed)
Only taking allopurinol 100 qd, to start 200 qd as prescribed, fu uric level next labs

## 2018-05-08 NOTE — Patient Instructions (Addendum)
OK to increase the toprol XL to 100 mg per day (you can take 2 of the 50 mg ones you have now to use them up as well)  Please continue all other medications as before, and refills have been done if requested.  Please have the pharmacy call with any other refills you may need.  Please continue your efforts at being more active, low cholesterol diet, and weight control.  Please keep your appointments with your specialists as you may have planned  Please return in April 6, or sooner if needed, with Lab testing done 3-5 days before

## 2018-05-08 NOTE — Assessment & Plan Note (Signed)
stable overall by history and exam, recent data reviewed with pt, and pt to continue medical treatment as before,  to f/u any worsening symptoms or concerns, for low fat diet

## 2018-05-08 NOTE — Progress Notes (Signed)
Subjective:    Patient ID: Peter Rivera, male    DOB: 01/01/1981, 38 y.o.   MRN: [id number]  HPI  Here to f/u; overall doing ok,  Pt denies chest pain, increasing sob or doe, wheezing, orthopnea, PND, increased LE swelling, palpitations, dizziness or syncope.  Pt denies new neurological symptoms such as new headache, or facial or extremity weakness or numbness.  Pt denies polydipsia, polyuria, or low sugar episode.  Pt states overall good compliance with meds, mostly trying to follow appropriate diet, with wt overall stable,  but little exercise however.  Gained some wt with poor diet, plans to change, get more exercise and for lower calorie diet.  Wt Readings from Last 3 Encounters:  05/08/18 265 lb (120.2 kg)  01/30/18 256 lb (116.1 kg)  08/01/17 253 lb (114.8 kg)  Wife sick last wk.   Pt denies fever, wt loss, night sweats, loss of appetite, or other constitutional symptoms  No new complaints Past Medical History:  Diagnosis Date  . 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 HTN (hypertension) 09/22/2010  . Hypertension   . Hypertriglyceridemia 12/29/2010  . Impaired glucose tolerance 03/29/2011   No past surgical history on file.  reports that he has never smoked. He has never used smokeless tobacco. He reports current alcohol use. He reports that he does not use drugs. family history includes Diabetes in his maternal grandfather and paternal grandmother; Healthy in his father and mother. No Known Allergies Current Outpatient Medications on File Prior to Visit  Medication Sig Dispense Refill  . allopurinol (ZYLOPRIM) 100 MG tablet Take 2 tablets (200 mg total) by mouth daily. 60 tablet 3  . hydrochlorothiazide (HYDRODIURIL) 25 MG tablet Take 1 tablet (25 mg total) by mouth daily. 90 tablet 3  . losartan (COZAAR) 100 MG tablet Take 1 tablet (100 mg total) by mouth daily. 90 tablet 3   No current facility-administered medications on file prior to visit.    Review of Systems  Constitutional: Negative for other unusual diaphoresis or sweats HENT: Negative for ear discharge or swelling Eyes: Negative for other worsening visual disturbances Respiratory: Negative for stridor or other swelling  Gastrointestinal: Negative for worsening distension or other blood Genitourinary: Negative for retention or other urinary change Musculoskeletal: Negative for other MSK pain or swelling Skin: Negative for color change or other new lesions Neurological: Negative for worsening tremors and other numbness  Psychiatric/Behavioral: Negative for worsening agitation or other fatigue All other system neg per pt    Objective:   Physical Exam BP (!) 152/102   Pulse (!) 109   Temp 97.9 F (36.6 C) (Oral)   Ht 5\' 4"  (1.626 m)   Wt 265 lb (120.2 kg)   SpO2 95%   BMI 45.49 kg/m  VS noted,  Constitutional: Pt appears in NAD HENT: Head: NCAT.  Right Ear: External ear normal.  Left Ear: External ear normal.  Eyes: . Pupils are equal, round, and reactive to light. Conjunctivae and EOM are normal Nose: without d/c or deformity Neck: Neck supple. Gross normal ROM Cardiovascular: Normal rate and regular rhythm.   Pulmonary/Chest: Effort normal and breath sounds without rales or wheezing.  Abd:  Soft, NT, ND, + BS, no organomegaly Neurological: Pt is alert. At baseline orientation, motor grossly intact Skin: Skin is warm. No rashes, other new lesions, no LE edema Psychiatric: Pt behavior is normal without agitation  No other exam findings  Lab Results  Component Value Date   WBC 7.5 08/01/2017   HGB  14.6 08/01/2017   HCT 44.5 08/01/2017   PLT 343.0 08/01/2017   GLUCOSE 106 (H) 08/01/2017   CHOL 150 08/01/2017   TRIG 189.0 (H) 08/01/2017   HDL 40.40 08/01/2017   LDLDIRECT 68.7 01/12/2013   LDLCALC 72 08/01/2017   ALT 32 08/01/2017   AST 23 08/01/2017   NA 140 08/01/2017   K 4.1 08/01/2017   CL 101 08/01/2017   CREATININE 1.13 08/01/2017   BUN 15 08/01/2017   CO2 30  08/01/2017   TSH 0.65 08/01/2017   HGBA1C 5.8 01/19/2017        Assessment & Plan:

## 2018-05-16 MED FILL — LOSARTAN POTASSIUM 100 MG T: 100 | 30 days supply | Qty: 30 | Fill #3

## 2018-06-20 MED FILL — ALLOPURINOL 100 MG TABS: 100 | 30 days supply | Qty: 60 | Fill #1

## 2018-06-20 MED FILL — LOSARTAN POTASSIUM 100 MG T: 100 | 30 days supply | Qty: 30 | Fill #4

## 2018-07-20 MED FILL — ALLOPURINOL 100 MG TABS: 100 | 30 days supply | Qty: 60 | Fill #2

## 2018-07-20 MED FILL — HYDROCHLOROTHIAZIDE 25 MG T: 25 | 90 days supply | Qty: 90 | Fill #2

## 2018-07-20 MED FILL — LOSARTAN POTASSIUM 100 MG T: 100 | 30 days supply | Qty: 30 | Fill #5

## 2018-07-20 MED FILL — METOPROLOL SUCCINATE ER 50: 50 | 90 days supply | Qty: 90 | Fill #3

## 2018-08-07 ENCOUNTER — Encounter: Payer: No Typology Code available for payment source | Admitting: Internal Medicine

## 2018-09-05 MED FILL — LOSARTAN POTASSIUM 100 MG T: 100 | 30 days supply | Qty: 30 | Fill #6

## 2018-09-05 MED FILL — METOPROLOL SUCCINATE ER 100: 100 | 90 days supply | Qty: 90 | Fill #1

## 2018-09-21 ENCOUNTER — Telehealth: Payer: Self-pay | Admitting: Internal Medicine

## 2018-09-21 DIAGNOSIS — G4710 Hypersomnia, unspecified: Secondary | ICD-10-CM

## 2018-09-21 NOTE — Telephone Encounter (Signed)
Oh no, sorry I do not order sleep tests or treat sleep apnea, thanks

## 2018-09-21 NOTE — Telephone Encounter (Signed)
Ok this is done 

## 2018-09-21 NOTE — Addendum Note (Signed)
Addended by: Corwin Levins on: 09/21/2018 01:09 PM   Modules accepted: Orders

## 2018-09-21 NOTE — Telephone Encounter (Signed)
Would like to know if Dr. 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

## 2018-09-21 NOTE — Telephone Encounter (Signed)
Can we do referral to pulmonary?

## 2018-09-21 NOTE — Telephone Encounter (Signed)
Pt has been informed.

## 2018-10-06 ENCOUNTER — Telehealth: Payer: Self-pay | Admitting: Internal Medicine

## 2018-10-06 NOTE — Telephone Encounter (Signed)
OK to switch to Virtual Visit.

## 2018-10-06 NOTE — Telephone Encounter (Signed)
Appt. has been changed to virtual, pt aware. Nothing further needed.

## 2018-10-06 NOTE — Telephone Encounter (Signed)
Called patient for COVID-19 pre-screening for in office visit.  Have you recently traveled any where out of the local area in the last 2 weeks? (YES- [place], will be returning tonight)  Have you been in close contact with a person diagnosed with COVID-19 within the last 2 weeks? No Do you currently have any of the following symptoms? If so, when did they start? Cough     Diarrhea  Joint Pain Fever      Muscle Pain  Red eyes Shortness of breath   Abdominal pain Vomiting Loss of smell    Rash   Sore Throat Headache    Weakness Bruising or bleeding  No symptoms to report   Okay to proceed with visit. (date)  / Needs to reschedule visit. (date)

## 2018-10-09 ENCOUNTER — Ambulatory Visit (INDEPENDENT_AMBULATORY_CARE_PROVIDER_SITE_OTHER): Payer: No Typology Code available for payment source | Admitting: Internal Medicine

## 2018-10-09 ENCOUNTER — Institutional Professional Consult (permissible substitution): Payer: No Typology Code available for payment source | Admitting: Internal Medicine

## 2018-10-09 DIAGNOSIS — G4719 Other hypersomnia: Secondary | ICD-10-CM

## 2018-10-09 NOTE — Progress Notes (Signed)
[facility]     Virtual Visit via Telephone Note I connected with patient on 10/09/18 at 11:30 AM EDT by video however due to technical difficulties this was converted to a telephone visit and verified that I am speaking with the correct person using two identifiers.   I discussed the limitations, risks, security and privacy concerns of performing an evaluation and management service by video and the availability of in person appointments. I also discussed with the patient that there may be a patient responsible charge related to this service. The patient expressed understanding and agreed to proceed. I discussed the assessment and treatment plan with the patient. The patient was provided an opportunity to ask questions and all were answered. The patient agreed with the plan and demonstrated an understanding of the instructions. Please see note below for further detail.    The patient was advised to call back or seek an in-person evaluation if the symptoms worsen or if the condition fails to improve as anticipated.  Shane CrutchPradeep Marshal Schrecengost, MD   Assessment and Plan:  Excessive daytime sleepiness. - Patient was identified he is at risk for sleep apnea at time of DOT physical. - We will send for sleep study to rule out sleep apnea.  Essential hypertension, impaired glucose tolerance. - Sleep apnea can contribute to above conditions, therefore treatment of sleep apnea is an important part of their management.  Orders Placed This Encounter  Procedures  . Home sleep test   No follow-ups on file.    Date: 10/09/2018  MRN# [id number] Peter Rivera 10/05/1980  Referring Physician: Oliver BarreJohn James, M.D. for daytime sleepiness.  Peter RegulusJason Buchbinder is a 38 y.o. old male     HPI:   He is a [employer] architecttruck driver, he went to get his DOT physical and was noted to have an elevated BMI, therefore he was sent for a sleep study.  He goes to bed at around 12 noon, falls asleep  easily, wakes at 7 to 9 pm, when wakes feels rested.  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  Denies sleep paralysis, cataplexy, sleep walking, no jaw pain, no TMJ, no dentures.     PMHX:   Past Medical History:  Diagnosis Date  . 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 HTN (hypertension) 09/22/2010  . Hypertension   . Hypertriglyceridemia 12/29/2010  . Impaired glucose tolerance 03/29/2011   Surgical Hx:  No past surgical history on file. Family Hx:  Family History  Problem Relation Age of Onset  . Diabetes Paternal Grandmother   . Healthy Mother   . Healthy Father   . Diabetes Maternal Grandfather    Social Hx:   Social History   Tobacco Use  . Smoking status: Never Smoker  . Smokeless tobacco: Never Used  Substance Use Topics  . Alcohol use: Yes    Alcohol/week: 0.0 standard drinks    Comment: occasional  . Drug use: No   Medication:    Current Outpatient Medications:  .  allopurinol (ZYLOPRIM) 100 MG tablet, Take 2 tablets (200 mg total) by mouth daily., Disp: 60 tablet, Rfl: 3 .  hydrochlorothiazide (HYDRODIURIL) 25 MG tablet, Take 1 tablet (25 mg total) by mouth daily., Disp: 90 tablet, Rfl: 3 .  losartan (COZAAR) 100 MG tablet, Take 1 tablet (100 mg total) by mouth daily., Disp: 90 tablet, Rfl: 3 .  metoprolol succinate (TOPROL-XL) 100 MG 24 hr tablet, Take 1 tablet (  100 mg total) by mouth daily. Take with or immediately following a meal., Disp: 90 tablet, Rfl: 3   Allergies:  Patient has no known allergies.  Review of Systems: Gen:  Denies  fever, sweats, chills HEENT: Denies blurred vision, double vision. bleeds, sore throat Cvc:  No dizziness, chest pain. Resp:   Denies cough or sputum production, shortness of breath Gi: Denies swallowing difficulty, stomach pain. Gu:  Denies bladder incontinence, burning urine Ext:   No Joint pain, stiffness. Skin: No skin rash,  hives  Endoc:  No polyuria,  polydipsia. Psych: No depression, insomnia. Other:  All other systems were reviewed with the patient and were negative other that what is mentioned in the HPI.   Physical Examination:  -    LABORATORY PANEL:   CBC No results for input(s): WBC, HGB, HCT, PLT in the last 168 hours. ------------------------------------------------------------------------------------------------------------------  Chemistries  No results for input(s): NA, K, CL, CO2, GLUCOSE, BUN, CREATININE, CALCIUM, MG, AST, ALT, ALKPHOS, BILITOT in the last 168 hours.  Invalid input(s): GFRCGP ------------------------------------------------------------------------------------------------------------------  Cardiac Enzymes No results for input(s): TROPONINI in the last 168 hours. ------------------------------------------------------------  RADIOLOGY:  No results found.     Thank  you for the consultation and for allowing [facility] to assist in the care of your patient. Our recommendations are noted above.  Please contact us if we can be of further service.   Marda Stalker, M.D., F.C.C.P.  Board Certified in Internal Medicine, Pulmonary Medicine, [place], and Sleep Medicine.  [facility] Office Number: [phone]   10/09/2018

## 2018-10-09 NOTE — Patient Instructions (Signed)

## 2018-10-12 MED FILL — HYDROCHLOROTHIAZIDE 25 MG T: 25 | 90 days supply | Qty: 90 | Fill #3

## 2018-10-12 MED FILL — ALLOPURINOL 100 MG TABS: 100 | 30 days supply | Qty: 60 | Fill #3

## 2018-10-12 MED FILL — LOSARTAN POTASSIUM 100 MG T: 100 | 30 days supply | Qty: 30 | Fill #7

## 2018-10-13 ENCOUNTER — Other Ambulatory Visit: Payer: Self-pay

## 2018-10-13 ENCOUNTER — Ambulatory Visit: Payer: No Typology Code available for payment source

## 2018-10-13 DIAGNOSIS — G4719 Other hypersomnia: Secondary | ICD-10-CM

## 2018-10-16 ENCOUNTER — Encounter: Payer: No Typology Code available for payment source | Admitting: Internal Medicine

## 2018-10-16 DIAGNOSIS — G4733 Obstructive sleep apnea (adult) (pediatric): Secondary | ICD-10-CM | POA: Diagnosis not present

## 2018-10-17 ENCOUNTER — Telehealth: Payer: Self-pay

## 2018-10-17 DIAGNOSIS — G4733 Obstructive sleep apnea (adult) (pediatric): Secondary | ICD-10-CM | POA: Diagnosis not present

## 2018-10-17 NOTE — Telephone Encounter (Signed)
Spoke to patient regarding sleep study results. Very Obstructive Sleep Apnea with AHI 88. Patient wished to proceed with CPAP therapy. Orders entered.

## 2018-11-20 ENCOUNTER — Ambulatory Visit (INDEPENDENT_AMBULATORY_CARE_PROVIDER_SITE_OTHER): Payer: No Typology Code available for payment source | Admitting: Internal Medicine

## 2018-11-20 ENCOUNTER — Other Ambulatory Visit: Payer: Self-pay

## 2018-11-20 ENCOUNTER — Encounter: Payer: Self-pay | Admitting: Internal Medicine

## 2018-11-20 ENCOUNTER — Other Ambulatory Visit (INDEPENDENT_AMBULATORY_CARE_PROVIDER_SITE_OTHER): Payer: No Typology Code available for payment source

## 2018-11-20 VITALS — BP 136/88 | HR 103 | Temp 98.1°F | Ht 64.0 in | Wt 268.0 lb

## 2018-11-20 DIAGNOSIS — E611 Iron deficiency: Secondary | ICD-10-CM

## 2018-11-20 DIAGNOSIS — Z0000 Encounter for general adult medical examination without abnormal findings: Secondary | ICD-10-CM

## 2018-11-20 DIAGNOSIS — E538 Deficiency of other specified B group vitamins: Secondary | ICD-10-CM

## 2018-11-20 DIAGNOSIS — M109 Gout, unspecified: Secondary | ICD-10-CM

## 2018-11-20 DIAGNOSIS — E559 Vitamin D deficiency, unspecified: Secondary | ICD-10-CM | POA: Diagnosis not present

## 2018-11-20 DIAGNOSIS — R7302 Impaired glucose tolerance (oral): Secondary | ICD-10-CM

## 2018-11-20 LAB — CBC WITH DIFFERENTIAL/PLATELET
Basophils Absolute: 0.1 10*3/uL (ref 0.0–0.1)
Basophils Relative: 0.7 % (ref 0.0–3.0)
Eosinophils Absolute: 0.1 10*3/uL (ref 0.0–0.7)
Eosinophils Relative: 1.5 % (ref 0.0–5.0)
HCT: 41.6 % (ref 39.0–52.0)
Hemoglobin: 13.8 g/dL (ref 13.0–17.0)
Lymphocytes Relative: 21.8 % (ref 12.0–46.0)
Lymphs Abs: 1.9 10*3/uL (ref 0.7–4.0)
MCHC: 33.1 g/dL (ref 30.0–36.0)
MCV: 77.6 fl — ABNORMAL LOW (ref 78.0–100.0)
Monocytes Absolute: 0.7 10*3/uL (ref 0.1–1.0)
Monocytes Relative: 7.7 % (ref 3.0–12.0)
Neutro Abs: 6 10*3/uL (ref 1.4–7.7)
Neutrophils Relative %: 68.3 % (ref 43.0–77.0)
Platelets: 318 10*3/uL (ref 150.0–400.0)
RBC: 5.36 Mil/uL (ref 4.22–5.81)
RDW: 14.9 % (ref 11.5–15.5)
WBC: 8.8 10*3/uL (ref 4.0–10.5)

## 2018-11-20 LAB — URINALYSIS, ROUTINE W REFLEX MICROSCOPIC
Bilirubin Urine: NEGATIVE
Ketones, ur: NEGATIVE
Leukocytes,Ua: NEGATIVE
Nitrite: NEGATIVE
RBC / HPF: NONE SEEN (ref 0–?)
Specific Gravity, Urine: 1.02 (ref 1.000–1.030)
Total Protein, Urine: NEGATIVE
Urine Glucose: NEGATIVE
Urobilinogen, UA: 0.2 (ref 0.0–1.0)
pH: 6 (ref 5.0–8.0)

## 2018-11-20 LAB — HEPATIC FUNCTION PANEL
ALT: 28 U/L (ref 0–53)
AST: 22 U/L (ref 0–37)
Albumin: 4.1 g/dL (ref 3.5–5.2)
Alkaline Phosphatase: 88 U/L (ref 39–117)
Bilirubin, Direct: 0.1 mg/dL (ref 0.0–0.3)
Total Bilirubin: 0.3 mg/dL (ref 0.2–1.2)
Total Protein: 7.4 g/dL (ref 6.0–8.3)

## 2018-11-20 LAB — BASIC METABOLIC PANEL
BUN: 23 mg/dL (ref 6–23)
CO2: 25 mEq/L (ref 19–32)
Calcium: 8.7 mg/dL (ref 8.4–10.5)
Chloride: 105 mEq/L (ref 96–112)
Creatinine, Ser: 1.25 mg/dL (ref 0.40–1.50)
GFR: 77.96 mL/min (ref >60.00)
Glucose, Bld: 101 mg/dL — ABNORMAL HIGH (ref 70–99)
Potassium: 3.4 mEq/L — ABNORMAL LOW (ref 3.5–5.1)
Sodium: 141 mEq/L (ref 135–145)

## 2018-11-20 LAB — LIPID PANEL
Cholesterol: 166 mg/dL (ref 0–200)
HDL: 43.7 mg/dL (ref >39.00)
NonHDL: 122.52
Total CHOL/HDL Ratio: 4
Triglycerides: 280 mg/dL — ABNORMAL HIGH (ref 0.0–149.0)
VLDL: 56 mg/dL — ABNORMAL HIGH (ref 0.0–40.0)

## 2018-11-20 LAB — IBC PANEL
Iron: 67 ug/dL (ref 42–165)
Saturation Ratios: 22.5 % (ref 20.0–50.0)
Transferrin: 213 mg/dL (ref 212.0–360.0)

## 2018-11-20 LAB — URIC ACID: Uric Acid, Serum: 8.7 mg/dL — ABNORMAL HIGH (ref 4.0–7.8)

## 2018-11-20 LAB — TSH: TSH: 0.88 u[IU]/mL (ref 0.35–4.50)

## 2018-11-20 LAB — LDL CHOLESTEROL, DIRECT: Direct LDL: 98 mg/dL

## 2018-11-20 LAB — HEMOGLOBIN A1C: Hgb A1c MFr Bld: 5.8 % (ref 4.6–6.5)

## 2018-11-20 NOTE — Progress Notes (Signed)
Subjective:    Patient ID: Peter Rivera, male    DOB: 05/17/1980, 38 y.o.   MRN: [id number]  HPI  Here for wellness and f/u;  Overall doing ok;  Pt denies Chest pain, worsening SOB, DOE, wheezing, orthopnea, PND, worsening LE edema, palpitations, dizziness or syncope.  Pt denies neurological change such as new headache, facial or extremity weakness.  Pt denies polydipsia, polyuria, or low sugar symptoms. Pt states overall good compliance with treatment and medications, good tolerability, and has been trying to follow appropriate diet.  Pt denies worsening depressive symptoms, suicidal ideation or panic. No fever, night sweats, wt loss, loss of appetite, or other constitutional symptoms.  Pt states good ability with ADL's, has low fall risk, home safety reviewed and adequate, no other significant changes in hearing or vision, and only occasionally active with exercise. Adopting a child soon Past Medical History:  Diagnosis Date  . 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 HTN (hypertension) 09/22/2010  . Hypertension   . Hypertriglyceridemia 12/29/2010  . Impaired glucose tolerance 03/29/2011   No past surgical history on file.  reports that he has never smoked. He has never used smokeless tobacco. He reports current alcohol use. He reports that he does not use drugs. family history includes Diabetes in his maternal grandfather and paternal grandmother; Healthy in his father and mother. No Known Allergies Current Outpatient Medications on File Prior to Visit  Medication Sig Dispense Refill  . allopurinol (ZYLOPRIM) 100 MG tablet Take 2 tablets (200 mg total) by mouth daily. 60 tablet 3  . hydrochlorothiazide (HYDRODIURIL) 25 MG tablet Take 1 tablet (25 mg total) by mouth daily. 90 tablet 3  . losartan (COZAAR) 100 MG tablet Take 1 tablet (100 mg total) by mouth daily. 90 tablet 3  . metoprolol succinate (TOPROL-XL) 100 MG 24 hr tablet Take 1 tablet (100 mg total) by mouth daily. Take with or  immediately following a meal. 90 tablet 3   No current facility-administered medications on file prior to visit.    Review of Systems Constitutional: Negative for other unusual diaphoresis, sweats, appetite or weight changes HENT: Negative for other worsening hearing loss, ear pain, facial swelling, mouth sores or neck stiffness.   Eyes: Negative for other worsening pain, redness or other visual disturbance.  Respiratory: Negative for other stridor or swelling Cardiovascular: Negative for other palpitations or other chest pain  Gastrointestinal: Negative for worsening diarrhea or loose stools, blood in stool, distention or other pain Genitourinary: Negative for hematuria, flank pain or other change in urine volume.  Musculoskeletal: Negative for myalgias or other joint swelling.  Skin: Negative for other color change, or other wound or worsening drainage.  Neurological: Negative for other syncope or numbness. Hematological: Negative for other adenopathy or swelling Psychiatric/Behavioral: Negative for hallucinations, other worsening agitation, SI, self-injury, or new decreased concentration All other system neg per pt    Objective:   Physical Exam BP 136/88   Pulse (!) 103   Temp 98.1 F (36.7 C) (Oral)   Ht 5\' 4"  (1.626 m)   Wt 268 lb (121.6 kg)   SpO2 95%   BMI 46.00 kg/m  VS noted,  Constitutional: Pt is oriented to person, place, and time. Appears well-developed and well-nourished, in no significant distress and comfortable Head: Normocephalic and atraumatic  Eyes: Conjunctivae and EOM are normal. Pupils are equal, round, and reactive to light Right Ear: External ear normal without discharge Left Ear: External ear normal without discharge Nose: Nose without discharge or deformity  Mouth/Throat: Oropharynx is without other ulcerations and moist  Neck: Normal range of motion. Neck supple. No JVD present. No tracheal deviation present or significant neck LA or mass  Cardiovascular: Normal rate, regular rhythm, normal heart sounds and intact distal pulses.   Pulmonary/Chest: WOB normal and breath sounds without rales or wheezing  Abdominal: Soft. Bowel sounds are normal. NT. No HSM  Musculoskeletal: Normal range of motion. Exhibits no edema Lymphadenopathy: Has no other cervical adenopathy.  Neurological: Pt is alert and oriented to person, place, and time. Pt has normal reflexes. No cranial nerve deficit. Motor grossly intact, Gait intact Skin: Skin is warm and dry. No rash noted or new ulcerations Psychiatric:  Has normal mood and affect. Behavior is normal without agitation No other exam findings Lab Results  Component Value Date   WBC 8.8 11/20/2018   HGB 13.8 11/20/2018   HCT 41.6 11/20/2018   PLT 318.0 11/20/2018   GLUCOSE 101 (H) 11/20/2018   CHOL 166 11/20/2018   TRIG 280.0 (H) 11/20/2018   HDL 43.70 11/20/2018   LDLDIRECT 98.0 11/20/2018   LDLCALC 72 08/01/2017   ALT 28 11/20/2018   AST 22 11/20/2018   NA 141 11/20/2018   K 3.4 (L) 11/20/2018   CL 105 11/20/2018   CREATININE 1.25 11/20/2018   BUN 23 11/20/2018   CO2 25 11/20/2018   TSH 0.88 11/20/2018   HGBA1C 5.8 11/20/2018        Assessment & Plan:

## 2018-11-20 NOTE — Patient Instructions (Signed)
Please continue all other medications as before, and refills have been done if requested.  Please have the pharmacy call with any other refills you may need.  Please continue your efforts at being more active, low cholesterol diet, and weight control.  You are otherwise up to date with prevention measures today.  Please keep your appointments with your specialists as you may have planned  We will fill out your papers when they are received  Please go to the LAB in the Basement (turn left off the elevator) for the tests to be done today  You will be contacted by phone if any changes need to be made immediately.  Otherwise, you will receive a letter about your results with an explanation, but please check with MyChart first.  Please remember to sign up for MyChart if you have not done so, as this will be important to you in the future with finding out test results, communicating by private email, and scheduling acute appointments online when needed.  Please return in 1 year for your yearly visit, or sooner if needed,

## 2018-11-21 ENCOUNTER — Other Ambulatory Visit: Payer: Self-pay | Admitting: Internal Medicine

## 2018-11-21 LAB — VITAMIN D 25 HYDROXY (VIT D DEFICIENCY, FRACTURES): VITD: 11.06 ng/mL — ABNORMAL LOW (ref 30.00–100.00)

## 2018-11-21 LAB — VITAMIN B12: Vitamin B-12: 348 pg/mL (ref 211–911)

## 2018-11-21 MED ORDER — VITAMIN D (ERGOCALCIFEROL) 1.25 MG (50000 UNIT) PO CAPS: 50000.0000 [IU] | ORAL_CAPSULE | ORAL | 0 refills | Status: DC

## 2018-11-21 MED FILL — LOSARTAN POTASSIUM 100 MG T: 100 | 30 days supply | Qty: 30 | Fill #8

## 2018-11-21 MED FILL — VIT D2 1.25 MG (50,000 UNIT: 1.25 MG | 84 days supply | Qty: 12 | Fill #0

## 2018-11-22 ENCOUNTER — Telehealth: Payer: Self-pay

## 2018-11-22 NOTE — Telephone Encounter (Signed)
-----   Message from Biagio Borg, MD sent at 11/21/2018 12:46 PM EDT ----- Left message on MyChart, pt to cont same tx except  The test results show that your current treatment is OK, except the Vitamin D level is low  Please take Vitamin D 50000 units weekly for 12 weeks, then plan to change to OTC Vitamin D3 at 2000 units per day, indefinitely.    Shahrukh Pasch to please inform pt, I will do rx

## 2018-11-22 NOTE — Telephone Encounter (Signed)
Pt has viewed results via MyChart  

## 2018-11-23 ENCOUNTER — Encounter: Payer: Self-pay | Admitting: Internal Medicine

## 2018-11-23 NOTE — Assessment & Plan Note (Signed)
stable overall by history and exam, recent data reviewed with pt, and pt to continue medical treatment as before,  to f/u any worsening symptoms or concerns  

## 2018-11-23 NOTE — Assessment & Plan Note (Signed)

## 2018-11-24 ENCOUNTER — Telehealth: Payer: Self-pay

## 2018-11-24 NOTE — Telephone Encounter (Signed)
Medical evaluation form has been completed by PCP and mailed to pt's home.

## 2018-12-04 NOTE — Telephone Encounter (Signed)
Do you still have this form? Pt is stating that form is missing height and weight.  Also does not mention high blood pressure or gout.

## 2018-12-06 NOTE — Telephone Encounter (Signed)
I do not have form. It was mailed to pt's home.

## 2018-12-06 NOTE — Telephone Encounter (Signed)
Printing new form to be completed.

## 2018-12-06 NOTE — Telephone Encounter (Signed)
Done hardcopy to shirron 

## 2018-12-07 NOTE — Telephone Encounter (Signed)
Faxed form to patient.  Copy sent to scan.

## 2018-12-07 NOTE — Telephone Encounter (Signed)
Form given to Tammy 

## 2019-01-03 ENCOUNTER — Encounter: Payer: Self-pay | Admitting: Internal Medicine

## 2019-01-04 MED ORDER — LOSARTAN POTASSIUM 100 MG PO TABS: 100.0000 mg | ORAL_TABLET | Freq: Every day | ORAL | 3 refills | Status: DC

## 2019-01-04 MED FILL — LOSARTAN POTASSIUM 100 MG T: 100 | 30 days supply | Qty: 30 | Fill #0

## 2019-01-15 ENCOUNTER — Encounter: Payer: Self-pay | Admitting: Internal Medicine

## 2019-01-15 DIAGNOSIS — M1A9XX0 Chronic gout, unspecified, without tophus (tophi): Secondary | ICD-10-CM

## 2019-01-15 MED ORDER — ALLOPURINOL 100 MG PO TABS: 200.0000 mg | ORAL_TABLET | Freq: Every day | ORAL | 3 refills | Status: DC

## 2019-01-15 MED FILL — ALLOPURINOL 100 MG TABS: 100 | 90 days supply | Qty: 180 | Fill #0

## 2019-01-15 NOTE — Telephone Encounter (Signed)
Done erx 

## 2019-02-02 MED FILL — METOPROLOL SUCCINATE ER 100: 100 | 90 days supply | Qty: 90 | Fill #2

## 2019-02-02 MED FILL — LOSARTAN POTASSIUM 100 MG T: 100 | 30 days supply | Qty: 30 | Fill #1

## 2019-02-18 ENCOUNTER — Other Ambulatory Visit: Payer: Self-pay | Admitting: Internal Medicine

## 2019-03-05 MED FILL — LOSARTAN POTASSIUM 100 MG T: 100 | 30 days supply | Qty: 30 | Fill #2

## 2019-04-21 ENCOUNTER — Other Ambulatory Visit: Payer: Self-pay | Admitting: Internal Medicine

## 2019-04-21 MED FILL — LOSARTAN POTASSIUM 100 MG T: 100 | 30 days supply | Qty: 30 | Fill #3

## 2019-04-23 MED FILL — HYDROCHLOROTHIAZIDE 25 MG T: 25 | 90 days supply | Qty: 90 | Fill #0

## 2019-06-29 ENCOUNTER — Telehealth: Payer: Self-pay | Admitting: Internal Medicine

## 2019-06-29 NOTE — Telephone Encounter (Signed)
New message:   1.Medication Requested:metoprolol succinate (TOPROL-XL) 100 MG 24 hr tablet  2. Pharmacy (Name, Street, [place]):[facility] - [place], [place] - [address] [place]  3. On Med List: yes  4. Last Visit with PCP: 11/20/18  5. 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

## 2019-07-02 MED ORDER — METOPROLOL SUCCINATE ER 100 MG PO TB24: 100.0000 mg | ORAL_TABLET | Freq: Every day | ORAL | 1 refills | Status: DC

## 2019-07-02 NOTE — Telephone Encounter (Signed)
Requested medication sent to pharmacy 90 days with 1 refill

## 2019-07-14 ENCOUNTER — Ambulatory Visit: Payer: No Typology Code available for payment source

## 2019-07-21 ENCOUNTER — Ambulatory Visit: Payer: No Typology Code available for payment source

## 2019-07-29 ENCOUNTER — Ambulatory Visit: Payer: No Typology Code available for payment source | Attending: Internal Medicine

## 2019-07-29 DIAGNOSIS — Z23 Encounter for immunization: Secondary | ICD-10-CM

## 2019-07-29 NOTE — Progress Notes (Signed)
   Covid-19 Vaccination Clinic  Name:  Claud Gowan    MRN: [id number] DOB: 12-07-1980  07/29/2019  Mr. Boyajian was observed post Covid-19 immunization for 15 minutes without incident. He was provided with Vaccine Information Sheet and instruction to access the V-Safe system.   Mr. Woldt was instructed to call 911 with any severe reactions post vaccine: Marland Kitchen Difficulty breathing  . Swelling of face and throat  . A fast heartbeat  . A bad rash all over body  . Dizziness and weakness   Immunizations Administered    Name Date Dose VIS Date Route   Pfizer COVID-19 Vaccine 07/29/2019  2:25 PM 0.3 mL 04/13/2019 Intramuscular   Manufacturer: [employer], [employer]   Lot: [id number]   NDC: [address]-8

## 2019-08-21 ENCOUNTER — Ambulatory Visit: Payer: No Typology Code available for payment source | Attending: Internal Medicine

## 2019-08-21 DIAGNOSIS — Z23 Encounter for immunization: Secondary | ICD-10-CM

## 2019-08-21 NOTE — Progress Notes (Signed)
   Covid-19 Vaccination Clinic  Name:  Royce Stegman    MRN: [id number] DOB: 1980-05-30  08/21/2019  Mr. Dearden was observed post Covid-19 immunization for 15 minutes without incident. He was provided with Vaccine Information Sheet and instruction to access the V-Safe system.   Mr. Wehner was instructed to call 911 with any severe reactions post vaccine: Marland Kitchen Difficulty breathing  . Swelling of face and throat  . A fast heartbeat  . A bad rash all over body  . Dizziness and weakness   Immunizations Administered    Name Date Dose VIS Date Route   Pfizer COVID-19 Vaccine 08/21/2019  2:50 PM 0.3 mL 06/27/2018 Intramuscular   Manufacturer: [employer], [employer]   Lot: [id number]   NDC: [address]-9

## 2019-10-22 ENCOUNTER — Encounter: Payer: Self-pay | Admitting: Family

## 2019-10-22 ENCOUNTER — Other Ambulatory Visit: Payer: Self-pay | Admitting: Family

## 2019-10-22 ENCOUNTER — Telehealth (INDEPENDENT_AMBULATORY_CARE_PROVIDER_SITE_OTHER): Payer: No Typology Code available for payment source | Admitting: Family

## 2019-10-22 DIAGNOSIS — J209 Acute bronchitis, unspecified: Secondary | ICD-10-CM

## 2019-10-22 MED ORDER — BENZONATATE 200 MG PO CAPS: 200.0000 mg | ORAL_CAPSULE | Freq: Three times a day (TID) | ORAL | 0 refills | Status: DC | PRN

## 2019-10-22 MED ORDER — AZITHROMYCIN 250 MG PO TABS: ORAL_TABLET | ORAL | 0 refills | Status: DC

## 2019-10-22 MED ORDER — FLUTICASONE PROPIONATE 50 MCG/ACT NA SUSP: 2.0000 | Freq: Every day | NASAL | 6 refills | Status: DC

## 2019-10-22 NOTE — Progress Notes (Signed)
Peter Rivera is a 39 y.o. male with the following history as recorded in EpicCare:  Patient Active Problem List   Diagnosis Date Noted  . Obesity, morbid ([place]) 01/19/2016  . Plantar fasciitis 03/31/2015  . Abdominal pain 01/08/2015  . Allergic rhinitis 01/08/2015  . Gout 09/17/2011  . Impaired glucose tolerance 03/29/2011  . Hypertriglyceridemia 12/29/2010  . HTN (hypertension) 09/22/2010  . Preventative health care 09/22/2010    Current Outpatient Medications  Medication Sig Dispense Refill  . allopurinol (ZYLOPRIM) 100 MG tablet Take 2 tablets (200 mg total) by mouth daily. 180 tablet 3  . hydrochlorothiazide (HYDRODIURIL) 25 MG tablet TAKE 1 TABLET (25 MG TOTAL) BY MOUTH DAILY. 90 tablet 1  . losartan (COZAAR) 100 MG tablet Take 1 tablet (100 mg total) by mouth daily. 90 tablet 3  . metoprolol succinate (TOPROL-XL) 100 MG 24 hr tablet Take 1 tablet (100 mg total) by mouth daily. Take with or immediately following a meal. 90 tablet 1  . azithromycin (ZITHROMAX) 250 MG tablet 2 tabs po qd x 1 day; 1 tablet per day x 4 days; 6 tablet 0  . benzonatate (TESSALON) 200 MG capsule Take 1 capsule (200 mg total) by mouth 3 (three) times daily as needed. 30 capsule 0  . fluticasone (FLONASE) 50 MCG/ACT nasal spray Place 2 sprays into both nostrils daily. 16 g 6   No current facility-administered medications for this visit.    Allergies: Patient has no known allergies.  Past Medical History:  Diagnosis Date  . 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 HTN (hypertension) 09/22/2010  . Hypertension   . Hypertriglyceridemia 12/29/2010  . Impaired glucose tolerance 03/29/2011    No past surgical history on file.  Family History  Problem Relation Age of Onset  . Diabetes Paternal Grandmother   . Healthy Mother   . Healthy Father   . Diabetes Maternal Grandfather     Social History   Tobacco Use  . Smoking status: Never Smoker  . Smokeless tobacco: Never Used  Substance Use Topics  .  Alcohol use: Yes    Alcohol/week: 0.0 standard drinks    Comment: occasional    Subjective:   I connected with Peter Rivera on 10/22/19 at 10:40 AM EDT by a video enabled telemedicine application and verified that I am speaking with the correct person using two identifiers.   I discussed the limitations of evaluation and management by telemedicine and the availability of in person appointments. The patient expressed understanding and agreed to proceed. Provider in office/ patient is at home; provider and patient are only 2 people on video call.  Cough/ congestion x 2 weeks; "just don't feel like I can shake my cough." No shortness of breath, no fever; + productive cough; Fully vaccinated against COVID; Has tried using OTC Theraflu;     Objective:  There were no vitals filed for this visit.  General: Well developed, well nourished, in no acute distress  Head: Normocephalic and atraumatic  Lungs: Respirations unlabored;  Neurologic: Alert and oriented; speech intact; face symmetrical;   Assessment:  1. Acute bronchitis, unspecified organism     Plan:  Rx for Z-pak #1 take as directed; Flonase NS 2 sprays per nostril qd; Rx for Tessalon Perles 200 mg tid prn; increase fluids,rest and follow-up worse, no better; If no better, will need in office visit and could need CXR;   No follow-ups on file.  No orders of the defined types were placed in this encounter.   Requested Prescriptions  Signed Prescriptions Disp Refills  . azithromycin (ZITHROMAX) 250 MG tablet 6 tablet 0    Sig: 2 tabs po qd x 1 day; 1 tablet per day x 4 days;  . fluticasone (FLONASE) 50 MCG/ACT nasal spray 16 g 6    Sig: Place 2 sprays into both nostrils daily.  . benzonatate (TESSALON) 200 MG capsule 30 capsule 0    Sig: Take 1 capsule (200 mg total) by mouth 3 (three) times daily as needed.

## 2019-11-26 ENCOUNTER — Encounter: Payer: No Typology Code available for payment source | Admitting: Internal Medicine

## 2019-12-07 ENCOUNTER — Other Ambulatory Visit: Payer: Self-pay | Admitting: Internal Medicine

## 2019-12-14 ENCOUNTER — Encounter: Payer: No Typology Code available for payment source | Admitting: Internal Medicine

## 2019-12-14 DIAGNOSIS — Z0289 Encounter for other administrative examinations: Secondary | ICD-10-CM

## 2020-01-11 ENCOUNTER — Telehealth: Payer: Self-pay | Admitting: Internal Medicine

## 2020-01-18 ENCOUNTER — Other Ambulatory Visit: Payer: Self-pay

## 2020-01-18 MED ORDER — HYDROCHLOROTHIAZIDE 25 MG PO TABS: 25.0000 mg | ORAL_TABLET | Freq: Every day | ORAL | 0 refills | Status: DC

## 2020-01-18 NOTE — Telephone Encounter (Signed)
Patient is completely out of this medication, could you refill?  Appointment scheduled for 10.11.21  [facility] - [place], [place] - [facility] [address] Phone:  [phone]  Fax:  [phone]

## 2020-02-06 ENCOUNTER — Other Ambulatory Visit: Payer: Self-pay | Admitting: Internal Medicine

## 2020-02-06 NOTE — Telephone Encounter (Signed)
Please refill as per office routine med refill policy (all routine meds refilled for 3 mo or monthly per pt preference up to one year from last visit, then month to month grace period for 3 mo, then further med refills will have to be denied)  

## 2020-02-11 ENCOUNTER — Ambulatory Visit (INDEPENDENT_AMBULATORY_CARE_PROVIDER_SITE_OTHER): Payer: Managed Care, Other (non HMO) | Admitting: Internal Medicine

## 2020-02-11 ENCOUNTER — Other Ambulatory Visit: Payer: Self-pay

## 2020-02-11 ENCOUNTER — Other Ambulatory Visit: Payer: Self-pay | Admitting: Internal Medicine

## 2020-02-11 ENCOUNTER — Encounter: Payer: Self-pay | Admitting: Internal Medicine

## 2020-02-11 VITALS — BP 130/90 | HR 95 | Temp 98.6°F | Ht 64.0 in | Wt 269.0 lb

## 2020-02-11 DIAGNOSIS — Z23 Encounter for immunization: Secondary | ICD-10-CM

## 2020-02-11 DIAGNOSIS — E559 Vitamin D deficiency, unspecified: Secondary | ICD-10-CM

## 2020-02-11 DIAGNOSIS — M1A9XX0 Chronic gout, unspecified, without tophus (tophi): Secondary | ICD-10-CM

## 2020-02-11 DIAGNOSIS — R7302 Impaired glucose tolerance (oral): Secondary | ICD-10-CM

## 2020-02-11 DIAGNOSIS — M109 Gout, unspecified: Secondary | ICD-10-CM

## 2020-02-11 DIAGNOSIS — Z1159 Encounter for screening for other viral diseases: Secondary | ICD-10-CM

## 2020-02-11 DIAGNOSIS — Z0000 Encounter for general adult medical examination without abnormal findings: Secondary | ICD-10-CM | POA: Diagnosis not present

## 2020-02-11 LAB — CBC WITH DIFFERENTIAL/PLATELET
Basophils Absolute: 0.1 10*3/uL (ref 0.0–0.1)
Basophils Relative: 0.7 % (ref 0.0–3.0)
Eosinophils Absolute: 0.1 10*3/uL (ref 0.0–0.7)
Eosinophils Relative: 1.7 % (ref 0.0–5.0)
HCT: 43 % (ref 39.0–52.0)
Hemoglobin: 14.3 g/dL (ref 13.0–17.0)
Lymphocytes Relative: 28.5 % (ref 12.0–46.0)
Lymphs Abs: 2.3 10*3/uL (ref 0.7–4.0)
MCHC: 33.2 g/dL (ref 30.0–36.0)
MCV: 76.3 fl — ABNORMAL LOW (ref 78.0–100.0)
Monocytes Absolute: 0.7 10*3/uL (ref 0.1–1.0)
Monocytes Relative: 9 % (ref 3.0–12.0)
Neutro Abs: 4.8 10*3/uL (ref 1.4–7.7)
Neutrophils Relative %: 60.1 % (ref 43.0–77.0)
Platelets: 345 10*3/uL (ref 150.0–400.0)
RBC: 5.64 Mil/uL (ref 4.22–5.81)
RDW: 15.1 % (ref 11.5–15.5)
WBC: 8 10*3/uL (ref 4.0–10.5)

## 2020-02-11 LAB — HEPATIC FUNCTION PANEL
ALT: 30 U/L (ref 0–53)
AST: 22 U/L (ref 0–37)
Albumin: 4.1 g/dL (ref 3.5–5.2)
Alkaline Phosphatase: 100 U/L (ref 39–117)
Bilirubin, Direct: 0.1 mg/dL (ref 0.0–0.3)
Total Bilirubin: 0.4 mg/dL (ref 0.2–1.2)
Total Protein: 8 g/dL (ref 6.0–8.3)

## 2020-02-11 LAB — URINALYSIS, ROUTINE W REFLEX MICROSCOPIC
Bilirubin Urine: NEGATIVE
Hgb urine dipstick: NEGATIVE
Ketones, ur: NEGATIVE
Leukocytes,Ua: NEGATIVE
Nitrite: NEGATIVE
RBC / HPF: NONE SEEN (ref 0–?)
Specific Gravity, Urine: 1.02 (ref 1.000–1.030)
Total Protein, Urine: NEGATIVE
Urine Glucose: NEGATIVE
Urobilinogen, UA: 0.2 (ref 0.0–1.0)
WBC, UA: NONE SEEN (ref 0–?)
pH: 5.5 (ref 5.0–8.0)

## 2020-02-11 LAB — LIPID PANEL
Cholesterol: 151 mg/dL (ref 0–200)
HDL: 37.3 mg/dL — ABNORMAL LOW (ref >39.00)
LDL Cholesterol: 79 mg/dL (ref 0–99)
NonHDL: 113.52
Total CHOL/HDL Ratio: 4
Triglycerides: 174 mg/dL — ABNORMAL HIGH (ref 0.0–149.0)
VLDL: 34.8 mg/dL (ref 0.0–40.0)

## 2020-02-11 LAB — BASIC METABOLIC PANEL
BUN: 15 mg/dL (ref 6–23)
CO2: 28 mEq/L (ref 19–32)
Calcium: 9 mg/dL (ref 8.4–10.5)
Chloride: 103 mEq/L (ref 96–112)
Creatinine, Ser: 1.09 mg/dL (ref 0.40–1.50)
GFR: 84.55 mL/min (ref >60.00)
Glucose, Bld: 89 mg/dL (ref 70–99)
Potassium: 3.4 mEq/L — ABNORMAL LOW (ref 3.5–5.1)
Sodium: 139 mEq/L (ref 135–145)

## 2020-02-11 LAB — TSH: TSH: 0.52 u[IU]/mL (ref 0.35–4.50)

## 2020-02-11 LAB — PSA: PSA: 0.79 ng/mL (ref 0.10–4.00)

## 2020-02-11 LAB — HEMOGLOBIN A1C: Hgb A1c MFr Bld: 6 % (ref 4.6–6.5)

## 2020-02-11 LAB — URIC ACID: Uric Acid, Serum: 8.2 mg/dL — ABNORMAL HIGH (ref 4.0–7.8)

## 2020-02-11 MED ORDER — ALLOPURINOL 100 MG PO TABS: 200.0000 mg | ORAL_TABLET | Freq: Every day | ORAL | 3 refills | Status: DC

## 2020-02-11 MED ORDER — HYDROCHLOROTHIAZIDE 25 MG PO TABS
25.0000 mg | ORAL_TABLET | Freq: Every day | ORAL | 3 refills | Status: DC
Start: 2020-02-11 — End: 2020-02-11

## 2020-02-11 MED ORDER — LOSARTAN POTASSIUM 100 MG PO TABS
100.0000 mg | ORAL_TABLET | Freq: Every day | ORAL | 3 refills | Status: DC
Start: 2020-02-11 — End: 2020-02-11

## 2020-02-11 NOTE — Patient Instructions (Addendum)
You had the flu shot today  Please change to OTC Vitamin D3 at 2000 units per day, indefinitely.  Please continue all other medications as before, and refills have been done if requested.  Please have the pharmacy call with any other refills you may need.  Please continue your efforts at being more active, low cholesterol diet, and weight control.  You are otherwise up to date with prevention measures today.  Please keep your appointments with your specialists as you may have planned  Please go to the LAB at the blood drawing area for the tests to be done  You will be contacted by phone if any changes need to be made immediately.  Otherwise, you will receive a letter about your results with an explanation, but please check with MyChart first.  Please remember to sign up for MyChart if you have not done so, as this will be important to you in the future with finding out test results, communicating by private email, and scheduling acute appointments online when needed.  Please make an Appointment to return for your 1 year visit, or sooner if needed, with Lab testing by Appointment as well, to be done about 3-5 days before at the FIRST FLOOR Lab (so this is for TWO appointments - please see the scheduling desk as you leave)

## 2020-02-11 NOTE — Progress Notes (Signed)
Subjective:    Patient ID: Peter Rivera, male    DOB: [birth year], 39 y.o.   MRN: [id number]  HPI  Here for wellness and f/u;  Overall doing ok;  Pt denies Chest pain, worsening SOB, DOE, wheezing, orthopnea, PND, worsening LE edema, palpitations, dizziness or syncope.  Pt denies neurological change such as new headache, facial or extremity weakness.  Pt denies polydipsia, polyuria, or low sugar symptoms. Pt states overall good compliance with treatment and medications, good tolerability, and has been trying to follow appropriate diet.  Pt denies worsening depressive symptoms, suicidal ideation or panic. No fever, night sweats, wt loss, loss of appetite, or other constitutional symptoms.  Pt states good ability with ADL's, has low fall risk, home safety reviewed and adequate, no other significant changes in hearing or vision, and only occasionally active with exercise. No new complaints Past Medical History:  Diagnosis Date  . 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 HTN (hypertension) 09/22/2010  . Hypertension   . Hypertriglyceridemia 12/29/2010  . Impaired glucose tolerance 03/29/2011   History reviewed. No pertinent surgical history.  reports that he has never smoked. He has never used smokeless tobacco. He reports current alcohol use. He reports that he does not use drugs. family history includes Diabetes in his maternal grandfather and paternal grandmother; Healthy in his father and mother. No Known Allergies Current Outpatient Medications on File Prior to Visit  Medication Sig Dispense Refill  . benzonatate (TESSALON) 200 MG capsule Take 1 capsule (200 mg total) by mouth 3 (three) times daily as needed. 30 capsule 0  . fluticasone (FLONASE) 50 MCG/ACT nasal spray Place 2 sprays into both nostrils daily. 16 g 6  . metoprolol succinate (TOPROL-XL) 100 MG 24 hr tablet Take 1 tablet (100 mg total) by mouth daily. Take with or immediately following a meal. 90 tablet 1   No current  facility-administered medications on file prior to visit.   Review of Systems All otherwise neg per pt    Objective:   Physical Exam BP 130/90 (BP Location: Left Arm, Patient Position: Sitting, Cuff Size: Large)   Pulse 95   Temp 98.6 F (37 C) (Oral)   Ht 5\' 4"  (1.626 m)   Wt 269 lb (122 kg)   SpO2 96%   BMI 46.17 kg/m  VS noted,  Constitutional: Pt appears in NAD HENT: Head: NCAT.  Right Ear: External ear normal.  Left Ear: External ear normal.  Eyes: . Pupils are equal, round, and reactive to light. Conjunctivae and EOM are normal Nose: without d/c or deformity Neck: Neck supple. Gross normal ROM Cardiovascular: Normal rate and regular rhythm.   Pulmonary/Chest: Effort normal and breath sounds without rales or wheezing.  Abd:  Soft, NT, ND, + BS, no organomegaly Neurological: Pt is alert. At baseline orientation, motor grossly intact Skin: Skin is warm. No rashes, other new lesions, no LE edema Psychiatric: Pt behavior is normal without agitation  All otherwise neg per pt Lab Results  Component Value Date   WBC 8.0 02/11/2020   HGB 14.3 02/11/2020   HCT 43.0 02/11/2020   PLT 345.0 02/11/2020   GLUCOSE 89 02/11/2020   CHOL 151 02/11/2020   TRIG 174.0 (H) 02/11/2020   HDL 37.30 (L) 02/11/2020   LDLDIRECT 98.0 11/20/2018   LDLCALC 79 02/11/2020   ALT 30 02/11/2020   AST 22 02/11/2020   NA 139 02/11/2020   K 3.4 (L) 02/11/2020   CL 103 02/11/2020   CREATININE 1.09 02/11/2020   BUN  15 02/11/2020   CO2 28 02/11/2020   TSH 0.52 02/11/2020   PSA 0.79 02/11/2020   HGBA1C 6.0 02/11/2020      Assessment & Plan:

## 2020-02-12 LAB — HEPATITIS C ANTIBODY
Hepatitis C Ab: NONREACTIVE
SIGNAL TO CUT-OFF: 0.03 (ref <1.00)

## 2020-02-12 LAB — VITAMIN D 25 HYDROXY (VIT D DEFICIENCY, FRACTURES): VITD: 14.67 ng/mL — ABNORMAL LOW (ref 30.00–100.00)

## 2020-02-14 ENCOUNTER — Encounter: Payer: Self-pay | Admitting: Internal Medicine

## 2020-02-19 ENCOUNTER — Encounter: Payer: Self-pay | Admitting: Internal Medicine

## 2020-02-19 ENCOUNTER — Ambulatory Visit (INDEPENDENT_AMBULATORY_CARE_PROVIDER_SITE_OTHER): Payer: Managed Care, Other (non HMO) | Admitting: Internal Medicine

## 2020-02-19 ENCOUNTER — Other Ambulatory Visit: Payer: Self-pay

## 2020-02-19 ENCOUNTER — Other Ambulatory Visit: Payer: Self-pay | Admitting: Internal Medicine

## 2020-02-19 VITALS — BP 148/102 | HR 105 | Temp 98.7°F | Ht 64.0 in | Wt 269.0 lb

## 2020-02-19 DIAGNOSIS — R7302 Impaired glucose tolerance (oral): Secondary | ICD-10-CM | POA: Diagnosis not present

## 2020-02-19 DIAGNOSIS — E559 Vitamin D deficiency, unspecified: Secondary | ICD-10-CM | POA: Diagnosis not present

## 2020-02-19 DIAGNOSIS — M109 Gout, unspecified: Secondary | ICD-10-CM | POA: Diagnosis not present

## 2020-02-19 DIAGNOSIS — I10 Essential (primary) hypertension: Secondary | ICD-10-CM | POA: Diagnosis not present

## 2020-02-19 MED ORDER — PREDNISONE 10 MG PO TABS: ORAL_TABLET | ORAL | 0 refills | Status: DC

## 2020-02-19 MED ORDER — HYDROCODONE-ACETAMINOPHEN 5-325 MG PO TABS: 1.0000 | ORAL_TABLET | Freq: Four times a day (QID) | ORAL | 0 refills | Status: DC | PRN

## 2020-02-19 MED ORDER — ALLOPURINOL 300 MG PO TABS: 300.0000 mg | ORAL_TABLET | Freq: Every day | ORAL | 3 refills | Status: DC

## 2020-02-19 MED ORDER — INDOMETHACIN 50 MG PO CAPS: 50.0000 mg | ORAL_CAPSULE | Freq: Three times a day (TID) | ORAL | 3 refills | Status: DC | PRN

## 2020-02-19 MED ORDER — METHYLPREDNISOLONE ACETATE 80 MG/ML IJ SUSP
80.0000 mg | Freq: Once | INTRAMUSCULAR | Status: AC
  Administered 2020-02-19: 80 mg via INTRAMUSCULAR

## 2020-02-19 MED ORDER — KETOROLAC TROMETHAMINE 30 MG/ML IJ SOLN
30.0000 mg | Freq: Once | INTRAMUSCULAR | Status: AC
  Administered 2020-02-19: 30 mg via INTRAVENOUS

## 2020-02-19 NOTE — Progress Notes (Signed)
Subjective:    Patient ID: Peter Rivera, male    DOB: 1980/06/05, 39 y.o.   MRN: [id number]  HPI  Here to f/u; overall doing ok,  Pt denies chest pain, increasing sob or doe, wheezing, orthopnea, PND, increased LE swelling, palpitations, dizziness or syncope.  Pt denies new neurological symptoms such as new headache, or facial or extremity weakness or numbness.  Pt denies polydipsia, polyuria, or low sugar episode.  Pt states overall good compliance with meds, BP Readings from Last 3 Encounters:  02/19/20 (!) 148/102  02/11/20 130/90  11/20/18 136/88  Also c/o acute onset 1 wk left first MTP severe pain red swelling without trauma or fever, just woke up with it, not getting better, very very difficult to walk, almost fell getting here.   Past Medical History:  Diagnosis Date  . 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 HTN (hypertension) 09/22/2010  . Hypertension   . Hypertriglyceridemia 12/29/2010  . Impaired glucose tolerance 03/29/2011   History reviewed. No pertinent surgical history.  reports that he has never smoked. He has never used smokeless tobacco. He reports current alcohol use. He reports that he does not use drugs. family history includes Diabetes in his maternal grandfather and paternal grandmother; Healthy in his father and mother. No Known Allergies Current Outpatient Medications on File Prior to Visit  Medication Sig Dispense Refill  . benzonatate (TESSALON) 200 MG capsule Take 1 capsule (200 mg total) by mouth 3 (three) times daily as needed. 30 capsule 0  . fluticasone (FLONASE) 50 MCG/ACT nasal spray Place 2 sprays into both nostrils daily. 16 g 6  . hydrochlorothiazide (HYDRODIURIL) 25 MG tablet Take 1 tablet (25 mg total) by mouth daily. 90 tablet 3  . losartan (COZAAR) 100 MG tablet Take 1 tablet (100 mg total) by mouth daily. 90 tablet 3  . metoprolol succinate (TOPROL-XL) 100 MG 24 hr tablet Take 1 tablet (100 mg total) by mouth daily. Take with or immediately  following a meal. 90 tablet 1   No current facility-administered medications on file prior to visit.   Review of Systems All otherwise neg per pt    Objective:   Physical Exam BP (!) 148/102 (BP Location: Left Arm, Patient Position: Sitting, Cuff Size: Large)   Pulse (!) 105   Temp 98.7 F (37.1 C) (Oral)   Ht 5\' 4"  (1.626 m)   Wt 269 lb (122 kg)   SpO2 97%   BMI 46.17 kg/m  VS noted,  Constitutional: Pt appears in NAD HENT: Head: NCAT.  Right Ear: External ear normal.  Left Ear: External ear normal.  Eyes: . Pupils are equal, round, and reactive to light. Conjunctivae and EOM are normal Nose: without d/c or deformity Neck: Neck supple. Gross normal ROM Cardiovascular: Normal rate and regular rhythm.   Pulmonary/Chest: Effort normal and breath sounds without rales or wheezing.  Right first MTP with 2-3+ red, tender and swelling whole fit to above the ankle Neurological: Pt is alert. At baseline orientation, motor grossly intact Skin: Skin is warm. No rashes, other new lesions, no LE edema Psychiatric: Pt behavior is normal without agitation  All otherwise neg per pt Lab Results  Component Value Date   WBC 8.0 02/11/2020   HGB 14.3 02/11/2020   HCT 43.0 02/11/2020   PLT 345.0 02/11/2020   GLUCOSE 89 02/11/2020   CHOL 151 02/11/2020   TRIG 174.0 (H) 02/11/2020   HDL 37.30 (L) 02/11/2020   LDLDIRECT 98.0 11/20/2018   LDLCALC 79 02/11/2020  ALT 30 02/11/2020   AST 22 02/11/2020   NA 139 02/11/2020   K 3.4 (L) 02/11/2020   CL 103 02/11/2020   CREATININE 1.09 02/11/2020   BUN 15 02/11/2020   CO2 28 02/11/2020   TSH 0.52 02/11/2020   PSA 0.79 02/11/2020   HGBA1C 6.0 02/11/2020      Assessment & Plan:

## 2020-02-19 NOTE — Patient Instructions (Signed)
You had the steroid shot today (depomedrol 80 mg), and the toradol 30 mg for pain as well  Please take all new medication as prescribed - the prednisone scheduled for a few days, as well as the hydrocodone for pain only as needed  Ok to increase the allopurinol to 300 mg per day  Also you are given the Indocin (indomethacin) for any future attacks that try to get started  Please continue all other medications as before, and refills have been done if requested.  Please have the pharmacy call with any other refills you may need.  Please keep your appointments with your specialists as you may have planned

## 2020-02-24 ENCOUNTER — Encounter: Payer: Self-pay | Admitting: Internal Medicine

## 2020-02-24 DIAGNOSIS — M109 Gout, unspecified: Secondary | ICD-10-CM | POA: Insufficient documentation

## 2020-02-24 NOTE — Assessment & Plan Note (Signed)
stable overall by history and exam, recent data reviewed with pt, and pt to continue medical treatment as before,  to f/u any worsening symptoms or concerns  

## 2020-02-24 NOTE — Assessment & Plan Note (Addendum)
Rather severe 1 wk onset, for toradol 30 mg IM, depomedrol IM 80, vicodin prn and  to f/u any worsening symptoms or concerns  I spent 31 minutes in preparing to see the patient by review of recent labs, imaging and procedures, obtaining and reviewing separately obtained history, communicating with the patient and family or caregiver, ordering medications, tests or procedures, and documenting clinical information in the EHR including the differential Dx, treatment, and any further evaluation and other management of acute gouty arthritis, gout, htn, hyperglycemia, vit d def

## 2020-02-24 NOTE — Assessment & Plan Note (Signed)
Also for more chronic concerns - for increased allopurinol 300 qd, and indocin 50 tid prn,  to f/u any worsening symptoms or concerns

## 2020-02-24 NOTE — Assessment & Plan Note (Signed)
For oral replacement 2000 u qd

## 2020-02-24 NOTE — Assessment & Plan Note (Signed)
For vit d 2000 qd 

## 2020-02-24 NOTE — Assessment & Plan Note (Signed)

## 2020-02-24 NOTE — Assessment & Plan Note (Signed)
Elevated today likely reactive, OK at home, to continue current home med tx

## 2020-03-10 ENCOUNTER — Other Ambulatory Visit: Payer: Self-pay | Admitting: Internal Medicine

## 2020-03-10 NOTE — Telephone Encounter (Signed)
Please refill as per office routine med refill policy (all routine meds refilled for 3 mo or monthly per pt preference up to one year from last visit, then month to month grace period for 3 mo, then further med refills will have to be denied)  

## 2020-03-11 ENCOUNTER — Other Ambulatory Visit: Payer: Self-pay | Admitting: Internal Medicine

## 2020-09-10 ENCOUNTER — Encounter: Payer: Self-pay | Admitting: Internal Medicine

## 2020-09-10 ENCOUNTER — Ambulatory Visit (INDEPENDENT_AMBULATORY_CARE_PROVIDER_SITE_OTHER): Payer: No Typology Code available for payment source | Admitting: Internal Medicine

## 2020-09-10 ENCOUNTER — Ambulatory Visit: Payer: Managed Care, Other (non HMO) | Admitting: Internal Medicine

## 2020-09-10 ENCOUNTER — Other Ambulatory Visit: Payer: Self-pay

## 2020-09-10 VITALS — BP 122/80 | HR 113 | Temp 98.7°F | Ht 64.0 in | Wt 259.0 lb

## 2020-09-10 DIAGNOSIS — Z0001 Encounter for general adult medical examination with abnormal findings: Secondary | ICD-10-CM | POA: Diagnosis not present

## 2020-09-10 DIAGNOSIS — E559 Vitamin D deficiency, unspecified: Secondary | ICD-10-CM | POA: Diagnosis not present

## 2020-09-10 DIAGNOSIS — R7302 Impaired glucose tolerance (oral): Secondary | ICD-10-CM

## 2020-09-10 DIAGNOSIS — I10 Essential (primary) hypertension: Secondary | ICD-10-CM

## 2020-09-10 DIAGNOSIS — R21 Rash and other nonspecific skin eruption: Secondary | ICD-10-CM

## 2020-09-10 DIAGNOSIS — L304 Erythema intertrigo: Secondary | ICD-10-CM

## 2020-09-10 LAB — HEPATIC FUNCTION PANEL
ALT: 23 U/L (ref 0–53)
AST: 22 U/L (ref 0–37)
Albumin: 4.2 g/dL (ref 3.5–5.2)
Alkaline Phosphatase: 96 U/L (ref 39–117)
Bilirubin, Direct: 0.1 mg/dL (ref 0.0–0.3)
Total Bilirubin: 0.5 mg/dL (ref 0.2–1.2)
Total Protein: 8.2 g/dL (ref 6.0–8.3)

## 2020-09-10 LAB — BASIC METABOLIC PANEL
BUN: 24 mg/dL — ABNORMAL HIGH (ref 6–23)
CO2: 23 mEq/L (ref 19–32)
Calcium: 9.5 mg/dL (ref 8.4–10.5)
Chloride: 105 mEq/L (ref 96–112)
Creatinine, Ser: 1.3 mg/dL (ref 0.40–1.50)
GFR: 68.76 mL/min (ref >60.00)
Glucose, Bld: 87 mg/dL (ref 70–99)
Potassium: 3.5 mEq/L (ref 3.5–5.1)
Sodium: 140 mEq/L (ref 135–145)

## 2020-09-10 LAB — CBC WITH DIFFERENTIAL/PLATELET
Basophils Absolute: 0.1 10*3/uL (ref 0.0–0.1)
Basophils Relative: 0.6 % (ref 0.0–3.0)
Eosinophils Absolute: 0.2 10*3/uL (ref 0.0–0.7)
Eosinophils Relative: 1.7 % (ref 0.0–5.0)
HCT: 43.5 % (ref 39.0–52.0)
Hemoglobin: 14.4 g/dL (ref 13.0–17.0)
Lymphocytes Relative: 22 % (ref 12.0–46.0)
Lymphs Abs: 2.1 10*3/uL (ref 0.7–4.0)
MCHC: 33 g/dL (ref 30.0–36.0)
MCV: 76.5 fl — ABNORMAL LOW (ref 78.0–100.0)
Monocytes Absolute: 0.5 10*3/uL (ref 0.1–1.0)
Monocytes Relative: 4.9 % (ref 3.0–12.0)
Neutro Abs: 6.7 10*3/uL (ref 1.4–7.7)
Neutrophils Relative %: 70.8 % (ref 43.0–77.0)
Platelets: 359 10*3/uL (ref 150.0–400.0)
RBC: 5.68 Mil/uL (ref 4.22–5.81)
RDW: 15.9 % — ABNORMAL HIGH (ref 11.5–15.5)
WBC: 9.4 10*3/uL (ref 4.0–10.5)

## 2020-09-10 LAB — URINALYSIS, ROUTINE W REFLEX MICROSCOPIC
Bilirubin Urine: NEGATIVE
Ketones, ur: NEGATIVE
Leukocytes,Ua: NEGATIVE
Nitrite: NEGATIVE
Specific Gravity, Urine: 1.025 (ref 1.000–1.030)
Urine Glucose: NEGATIVE
Urobilinogen, UA: 0.2 (ref 0.0–1.0)
pH: 5.5 (ref 5.0–8.0)

## 2020-09-10 LAB — LIPID PANEL
Cholesterol: 150 mg/dL (ref 0–200)
HDL: 40.1 mg/dL (ref >39.00)
LDL Cholesterol: 77 mg/dL (ref 0–99)
NonHDL: 109.56
Total CHOL/HDL Ratio: 4
Triglycerides: 164 mg/dL — ABNORMAL HIGH (ref 0.0–149.0)
VLDL: 32.8 mg/dL (ref 0.0–40.0)

## 2020-09-10 LAB — HEMOGLOBIN A1C: Hgb A1c MFr Bld: 6 % (ref 4.6–6.5)

## 2020-09-10 LAB — VITAMIN D 25 HYDROXY (VIT D DEFICIENCY, FRACTURES): VITD: 45.68 ng/mL (ref 30.00–100.00)

## 2020-09-10 LAB — PSA: PSA: 0.71 ng/mL (ref 0.10–4.00)

## 2020-09-10 LAB — TSH: TSH: 0.61 u[IU]/mL (ref 0.35–4.50)

## 2020-09-10 MED ORDER — CLOTRIMAZOLE-BETAMETHASONE 1-0.05 % EX CREA
TOPICAL_CREAM | CUTANEOUS | 1 refills | Status: AC
  Filled 2020-09-10: qty 45, 20d supply, fill #0

## 2020-09-10 MED ORDER — METHYLPREDNISOLONE ACETATE 80 MG/ML IJ SUSP
80.0000 mg | Freq: Once | INTRAMUSCULAR | Status: AC
  Administered 2020-09-10: 80 mg via INTRAMUSCULAR

## 2020-09-10 MED ORDER — PREDNISONE 10 MG PO TABS
ORAL_TABLET | ORAL | 0 refills | Status: DC
  Filled 2020-09-10: qty 18, 9d supply, fill #0

## 2020-09-10 NOTE — Progress Notes (Signed)
Patient ID: Peter Rivera, male   DOB: 1980/09/28, 40 y.o.   MRN: [id number]         Chief Complaint:: wellness exam and Office Visit (Bumps on both arms)  , groin rash, low vit d , hyperglycemia       HPI:  Peter Rivera is a 40 y.o. male here for wellness exam; declines covid booster and Tdap for now, ow up to date with preventive referrals and immunizations                        Also c/o diffuse itchy rash to extremities and trunk with very small prutic raised bumps, cant stop itching x 2-3 days, not sure what may have caused it, but no tongue or other swelling, and Pt denies chest pain, increased sob or doe, wheezing, orthopnea, PND, increased LE swelling, palpitations, dizziness or syncope.  Does have a second groin rash more red, itchy and smelly maybe ongoing off an on for several months, as he gets sweaty during day.  Now taking Vit D 2000 qd.   Pt denies polydipsia, polyuria, or new focal neuro s/s.   Pt denies fever, wt loss, night sweats, loss of appetite, or other constitutional symptoms   No other new symptoms  Wt Readings from Last 3 Encounters:  09/10/20 259 lb (117.5 kg)  02/19/20 269 lb (122 kg)  02/11/20 269 lb (122 kg)   BP Readings from Last 3 Encounters:  09/10/20 122/80  02/19/20 (!) 148/102  02/11/20 130/90   Immunization History  Administered Date(s) Administered  . Influenza, Seasonal, Injecte, Preservative Fre 04/03/2013, 01/16/2014, 01/08/2015, 01/19/2016, 01/19/2017  . Influenza,inj,Quad PF,6+ Mos 04/03/2013, 01/16/2014, 01/08/2015, 01/19/2016, 01/19/2017, 01/30/2018, 02/11/2020  . PFIZER(Purple Top)SARS-COV-2 Vaccination 07/29/2019, 08/21/2019  . Tdap 09/22/2010   There are no preventive care reminders to display for this patient.    Past Medical History:  Diagnosis Date  . 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 HTN (hypertension) 09/22/2010  . Hypertension   . Hypertriglyceridemia 12/29/2010  . Impaired glucose tolerance 03/29/2011   History  reviewed. No pertinent surgical history.  reports that he has never smoked. He has never used smokeless tobacco. He reports current alcohol use. He reports that he does not use drugs. family history includes Diabetes in his maternal grandfather and paternal grandmother; Healthy in his father and mother. No Known Allergies Current Outpatient Medications on File Prior to Visit  Medication Sig Dispense Refill  . allopurinol (ZYLOPRIM) 300 MG tablet TAKE 1 TABLET (300 MG TOTAL) BY MOUTH DAILY. 90 tablet 3  . hydrochlorothiazide (HYDRODIURIL) 25 MG tablet TAKE 1 TABLET BY MOUTH DAILY. 90 tablet 3  . indomethacin (INDOCIN) 50 MG capsule TAKE 1 CAPSULE (50 MG TOTAL) BY MOUTH 3 (THREE) TIMES DAILY AS NEEDED FOR MILD PAIN. 90 capsule 3  . losartan (COZAAR) 100 MG tablet TAKE 1 TABLET BY MOUTH DAILY. 90 tablet 3  . metoprolol succinate (TOPROL-XL) 100 MG 24 hr tablet TAKE 1 TABLET BY MOUTH DAILY. TAKE WITH OR IMMEDIATELY FOLLOWING A MEAL. (Patient taking differently: Take by mouth daily. TAKE WITH OR IMMEDIATELY FOLLOWING A MEAL.) 90 tablet 1   No current facility-administered medications on file prior to visit.        ROS:  All others reviewed and negative.  Objective        PE:  BP 122/80 (BP Location: Right Arm, Patient Position: Sitting, Cuff Size: Large)   Pulse (!) 113   Temp 98.7 F (37.1 C) (Oral)  Ht 5\' 4"  (1.626 m)   Wt 259 lb (117.5 kg)   SpO2 96%   BMI 44.46 kg/m                 Constitutional: Pt appears in NAD               HENT: Head: NCAT.                Right Ear: External ear normal.                 Left Ear: External ear normal.                Eyes: . Pupils are equal, round, and reactive to light. Conjunctivae and EOM are normal               Nose: without d/c or deformity               Neck: Neck supple. Gross normal ROM               Cardiovascular: Normal rate and regular rhythm.                 Pulmonary/Chest: Effort normal and breath sounds without rales or  wheezing.                Abd:  Soft, NT, ND, + BS, no organomegaly               Neurological: Pt is alert. At baseline orientation, motor grossly intact               Skin: LE edema - none, has diffuse mild erythem raised bump nontender rash to all extremities and some torso diffuse as well, also candida type erythema to groin area nontender without swelling or drainage               Psychiatric: Pt behavior is normal without agitation   Micro: none  Cardiac tracings I have personally interpreted today:  none  Pertinent Radiological findings (summarize): none   Lab Results  Component Value Date   WBC 9.4 09/10/2020   HGB 14.4 09/10/2020   HCT 43.5 09/10/2020   PLT 359.0 09/10/2020   GLUCOSE 87 09/10/2020   CHOL 150 09/10/2020   TRIG 164.0 (H) 09/10/2020   HDL 40.10 09/10/2020   LDLDIRECT 98.0 11/20/2018   LDLCALC 77 09/10/2020   ALT 23 09/10/2020   AST 22 09/10/2020   NA 140 09/10/2020   K 3.5 09/10/2020   CL 105 09/10/2020   CREATININE 1.30 09/10/2020   BUN 24 (H) 09/10/2020   CO2 23 09/10/2020   TSH 0.61 09/10/2020   PSA 0.71 09/10/2020   HGBA1C 6.0 09/10/2020   Assessment/Plan:  Peter Rivera is a 40 y.o. Black or African American [2] male with  has a past medical history of Acute gouty arthropathy (08/16/2011), HTN (hypertension) (09/22/2010), Hypertension, Hypertriglyceridemia (12/29/2010), and Impaired glucose tolerance (03/29/2011).  Encounter for well adult exam with abnormal findings Age and sex appropriate education and counseling updated with regular exercise and diet Referrals for preventative services - none needed Immunizations addressed - declines covid or tdap Smoking counseling  - none needed Evidence for depression or other mood disorder - none significant Most recent labs reviewed. I have personally reviewed and have noted: 1) the patient's medical and social history 2) The patient's current medications and supplements 3) The patient's height,  weight, and BMI have been recorded in the chart  Vitamin D deficiency Last vitamin D Lab Results  Component Value Date   VD25OH 45.68 09/10/2020   Stable, cont oral replacement   Rash Diffuse c/w allergic type - for depomedrol im 80, and predpac asd,  to f/u any worsening symptoms or concerns  Intertrigo Mild to mod, for lotrisone bid prn,  to f/u any worsening symptoms or concerns  Impaired glucose tolerance Lab Results  Component Value Date   HGBA1C 6.0 09/10/2020   Stable, pt to continue current medical treatment  - diet   HTN (hypertension) BP Readings from Last 3 Encounters:  09/10/20 122/80  02/19/20 (!) 148/102  02/11/20 130/90   Stable, pt to continue medical treatment  - losartan, hct, toprol   Followup: Return in about 1 year (around 09/10/2021).  Oliver Barre, MD 09/17/2020 5:15 AM [facility]

## 2020-09-10 NOTE — Patient Instructions (Signed)
You had the steroid shot today  Please take all new medication as prescribed - the prednisone, and the antifungal/steroid cream as needed  Please continue all other medications as before, and refills have been done if requested.  Please have the pharmacy call with any other refills you may need.  Please continue your efforts at being more active, low cholesterol diet, and weight control.  You are otherwise up to date with prevention measures today.  Please keep your appointments with your specialists as you may have planned  Please go to the LAB at the blood drawing area for the tests to be done  You will be contacted by phone if any changes need to be made immediately.  Otherwise, you will receive a letter about your results with an explanation, but please check with MyChart first.  Please remember to sign up for MyChart if you have not done so, as this will be important to you in the future with finding out test results, communicating by private email, and scheduling acute appointments online when needed.  Please make an Appointment to return for your 1 year visit, or sooner if needed

## 2020-09-17 ENCOUNTER — Encounter: Payer: Self-pay | Admitting: Internal Medicine

## 2020-09-17 NOTE — Assessment & Plan Note (Signed)
Mild to mod, for lotrisone bid prn,  to f/u any worsening symptoms or concerns

## 2020-09-17 NOTE — Assessment & Plan Note (Signed)
Age and sex appropriate education and counseling updated with regular exercise and diet Referrals for preventative services - none needed Immunizations addressed - declines covid or tdap Smoking counseling  - none needed Evidence for depression or other mood disorder - none significant Most recent labs reviewed. I have personally reviewed and have noted: 1) the patient's medical and social history 2) The patient's current medications and supplements 3) The patient's height, weight, and BMI have been recorded in the chart

## 2020-09-17 NOTE — Assessment & Plan Note (Signed)
BP Readings from Last 3 Encounters:  09/10/20 122/80  02/19/20 (!) 148/102  02/11/20 130/90   Stable, pt to continue medical treatment  - losartan, hct, toprol

## 2020-09-17 NOTE — Assessment & Plan Note (Signed)
Lab Results  Component Value Date   HGBA1C 6.0 09/10/2020   Stable, pt to continue current medical treatment  - diet

## 2020-09-17 NOTE — Assessment & Plan Note (Signed)
Last vitamin D Lab Results  Component Value Date   VD25OH 45.68 09/10/2020   Stable, cont oral replacement

## 2020-09-17 NOTE — Assessment & Plan Note (Signed)
Diffuse c/w allergic type - for depomedrol im 80, and predpac asd,  to f/u any worsening symptoms or concerns

## 2020-09-29 MED FILL — Hydrochlorothiazide Tab 25 MG: ORAL | 90 days supply | Qty: 90 | Fill #0 | Status: AC

## 2020-09-30 ENCOUNTER — Other Ambulatory Visit: Payer: Self-pay

## 2020-10-21 ENCOUNTER — Other Ambulatory Visit: Payer: Self-pay

## 2020-10-23 ENCOUNTER — Other Ambulatory Visit: Payer: Self-pay | Admitting: Internal Medicine

## 2020-10-24 MED ORDER — METOPROLOL SUCCINATE ER 100 MG PO TB24: ORAL_TABLET | Freq: Every day | ORAL | 1 refills | Status: DC

## 2020-10-24 NOTE — Telephone Encounter (Signed)
Please refill as per office routine med refill policy (all routine meds refilled for 3 mo or monthly per pt preference up to one year from last visit, then month to month grace period for 3 mo, then further med refills will have to be denied)  

## 2020-12-30 ENCOUNTER — Other Ambulatory Visit: Payer: Self-pay

## 2021-02-16 ENCOUNTER — Encounter: Payer: Managed Care, Other (non HMO) | Admitting: Internal Medicine

## 2021-03-26 ENCOUNTER — Other Ambulatory Visit: Payer: Self-pay | Admitting: Internal Medicine

## 2021-03-26 NOTE — Telephone Encounter (Signed)
Please refill as per office routine med refill policy (all routine meds to be refilled for 3 mo or monthly (per pt preference) up to one year from last visit, then month to month grace period for 3 mo, then further med refills will have to be denied) ? ?

## 2021-04-17 ENCOUNTER — Other Ambulatory Visit: Payer: Self-pay | Admitting: Internal Medicine

## 2021-04-17 NOTE — Telephone Encounter (Signed)
Please refill as per office routine med refill policy (all routine meds to be refilled for 3 mo or monthly (per pt preference) up to one year from last visit, then month to month grace period for 3 mo, then further med refills will have to be denied) ? ?

## 2021-04-18 ENCOUNTER — Other Ambulatory Visit: Payer: Self-pay | Admitting: Internal Medicine

## 2021-04-18 NOTE — Telephone Encounter (Signed)
Please refill as per office routine med refill policy (all routine meds to be refilled for 3 mo or monthly (per pt preference) up to one year from last visit, then month to month grace period for 3 mo, then further med refills will have to be denied) ? ?

## 2021-04-28 ENCOUNTER — Other Ambulatory Visit: Payer: Self-pay | Admitting: Internal Medicine

## 2021-06-30 ENCOUNTER — Ambulatory Visit: Payer: No Typology Code available for payment source | Admitting: Internal Medicine

## 2021-07-08 ENCOUNTER — Ambulatory Visit: Payer: No Typology Code available for payment source | Admitting: Internal Medicine

## 2021-07-16 ENCOUNTER — Other Ambulatory Visit: Payer: Self-pay

## 2021-07-16 ENCOUNTER — Other Ambulatory Visit: Payer: Self-pay | Admitting: Internal Medicine

## 2021-07-16 ENCOUNTER — Ambulatory Visit: Payer: BC Managed Care – PPO | Admitting: Internal Medicine

## 2021-07-16 ENCOUNTER — Encounter: Payer: Self-pay | Admitting: Internal Medicine

## 2021-07-16 VITALS — BP 150/98 | HR 93 | Temp 99.3°F | Ht 64.0 in | Wt 261.2 lb

## 2021-07-16 DIAGNOSIS — M79641 Pain in right hand: Secondary | ICD-10-CM

## 2021-07-16 DIAGNOSIS — I10 Essential (primary) hypertension: Secondary | ICD-10-CM | POA: Diagnosis not present

## 2021-07-16 DIAGNOSIS — E538 Deficiency of other specified B group vitamins: Secondary | ICD-10-CM | POA: Diagnosis not present

## 2021-07-16 DIAGNOSIS — E559 Vitamin D deficiency, unspecified: Secondary | ICD-10-CM | POA: Diagnosis not present

## 2021-07-16 DIAGNOSIS — Z23 Encounter for immunization: Secondary | ICD-10-CM | POA: Diagnosis not present

## 2021-07-16 DIAGNOSIS — Z0001 Encounter for general adult medical examination with abnormal findings: Secondary | ICD-10-CM

## 2021-07-16 DIAGNOSIS — R7302 Impaired glucose tolerance (oral): Secondary | ICD-10-CM | POA: Diagnosis not present

## 2021-07-16 MED ORDER — MELOXICAM 15 MG PO TABS
15.0000 mg | ORAL_TABLET | Freq: Every day | ORAL | 3 refills | Status: DC | PRN
  Filled 2021-07-16: qty 90, 90d supply, fill #0

## 2021-07-16 MED ORDER — MELOXICAM 15 MG PO TABS: 15.0000 mg | ORAL_TABLET | Freq: Every day | ORAL | 3 refills | Status: AC | PRN

## 2021-07-16 NOTE — Telephone Encounter (Signed)
Please refill as per office routine med refill policy (all routine meds to be refilled for 3 mo or monthly (per pt preference) up to one year from last visit, then month to month grace period for 3 mo, then further med refills will have to be denied) ? ?

## 2021-07-16 NOTE — Patient Instructions (Addendum)
Please take all new medication as prescribed - the meloxicam (anti-inflammatory) for the right hand tendonitis (this was sent to [facility] as well as also the [facility] just in case) ? ?You had the Tdap tetanus shot today ? ?Please continue all other medications as before, and refills have been done if requested. ? ?Please have the pharmacy call with any other refills you may need. ? ?Please continue your efforts at being more active, low cholesterol diet, and weight control. ? ?You are otherwise up to date with prevention measures today. ? ?Please keep your appointments with your specialists as you may have planned ? ?Please go to the LAB at the blood drawing area for the tests to be done - at the [facility] when you can ? ?You will be contacted by phone if any changes need to be made immediately.  Otherwise, you will receive a letter about your results with an explanation, but please check with MyChart first. ? ?Please remember to sign up for MyChart if you have not done so, as this will be important to you in the future with finding out test results, communicating by private email, and scheduling acute appointments online when needed. ? ?Please make an Appointment to return for your 1 year visit, or sooner if needed ?

## 2021-07-16 NOTE — Assessment & Plan Note (Signed)
Last vitamin D ?Lab Results  ?Component Value Date  ? VD25OH 45.68 09/10/2020  ? ?Stable, cont oral replacement ? ?

## 2021-07-16 NOTE — Progress Notes (Signed)
Patient ID: Peter Rivera, male   DOB: [birth year], 41 y.o.   MRN: [id number] ? ? ? ?     Chief Complaint:: wellness exam and Follow-up (Patient c/o having pain and burning in both hands mostly right) ? , htn, hyperglycemia, low vit d ? ?     HPI:  Peter Rivera is a 41 y.o. male here for wellness exam; declines flu shot, covid booster, due for tdap, o/w up to date.   ?         ?              Also not taking vit d.  Pt denies chest pain, increased sob or doe, wheezing, orthopnea, PND, increased LE swelling, palpitations, dizziness or syncope.   Pt denies polydipsia, polyuria, or new focal neuro s/s.   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   ?  ?Wt Readings from Last 3 Encounters:  ?07/16/21 261 lb 3.2 oz (118.5 kg)  ?09/10/20 259 lb (117.5 kg)  ?02/19/20 269 lb (122 kg)  ? ?BP Readings from Last 3 Encounters:  ?07/16/21 (!) 150/98  ?09/10/20 122/80  ?02/19/20 (!) 148/102  ? ?Immunization History  ?Administered Date(s) Administered  ? Influenza, Seasonal, Injecte, Preservative Fre 04/03/2013, 01/16/2014, 01/08/2015, 01/19/2016, 01/19/2017  ? Influenza,inj,Quad PF,6+ Mos 04/03/2013, 01/16/2014, 01/08/2015, 01/19/2016, 01/19/2017, 01/30/2018, 02/11/2020  ? PFIZER(Purple Top)SARS-COV-2 Vaccination 07/29/2019, 08/21/2019  ? Tdap 09/22/2010, 07/16/2021  ? ?There are no preventive care reminders to display for this patient. ? ?  ? ?Past Medical History:  ?Diagnosis Date  ? 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 HTN (hypertension) 09/22/2010  ? Hypertension   ? Hypertriglyceridemia 12/29/2010  ? Impaired glucose tolerance 03/29/2011  ? ?History reviewed. No pertinent surgical history. ? reports that he has never smoked. He has never used smokeless tobacco. He reports current  alcohol use. He reports that he does not use drugs. ?family history includes Diabetes in his maternal grandfather and paternal grandmother; Healthy in his father and mother. ?No Known Allergies ?Current Outpatient Medications on File Prior to Visit  ?Medication Sig Dispense Refill  ? allopurinol (ZYLOPRIM) 300 MG tablet TAKE ONE TABLET BY MOUTH DAILY 90 tablet 1  ? clotrimazole-betamethasone (LOTRISONE) cream Use as directed twice per day as needed to affected area 45 g 1  ? hydrochlorothiazide (HYDRODIURIL) 25 MG tablet TAKE ONE TABLET BY MOUTH DAILY 90 tablet 3  ? indomethacin (INDOCIN) 50 MG capsule TAKE ONE CAPSULE BY MOUTH THREE TIMES A DAY AS NEEDED FOR MILD PAIN 90 capsule 1  ? losartan (COZAAR) 100 MG tablet TAKE ONE TABLET BY MOUTH DAILY 90 tablet 1  ? ?No current facility-administered medications on file prior to visit.  ? ?     ROS:  All others reviewed and negative. ? ?Objective  ? ?     PE:  BP (!) 150/98 (BP Location: Right Arm, Patient Position: Sitting, Cuff Size: Large)   Pulse 93   Temp 99.3 ?F (37.4 ?C) (Oral)   Ht 5\' 4"  (1.626 m)   Wt 261 lb 3.2 oz (118.5 kg)   SpO2 97%   BMI 44.83 kg/m?  ? ?              Constitutional: Pt appears  in NAD ?              HENT: Head: NCAT.  ?              Right Ear: External ear normal.   ?              Left Ear: External ear normal.  ?              Eyes: . Pupils are equal, round, and reactive to light. Conjunctivae and EOM are normal ?              Nose: without d/c or deformity ?              Neck: Neck supple. Gross normal ROM ?              Cardiovascular: Normal rate and regular rhythm.   ?              Pulmonary/Chest: Effort normal and breath sounds without rales or wheezing.  ?              Abd:  Soft, NT, ND, + BS, no organomegaly ?              Neurological: Pt is alert. At baseline orientation, motor grossly intact ?              Skin: Skin is warm. No rashes, no other new lesions, LE edema -none; right post hand with diffuse mild tender swelling  o/w neurovasc intact ?              Psychiatric: Pt behavior is normal without agitation  ? ?Micro: none ? ?Cardiac tracings I have personally interpreted today:  none ? ?Pertinent Radiological findings (summarize): none  ? ?Lab Results  ?Component Value Date  ? WBC 9.4 09/10/2020  ? HGB 14.4 09/10/2020  ? HCT 43.5 09/10/2020  ? PLT 359.0 09/10/2020  ? GLUCOSE 87 09/10/2020  ? CHOL 150 09/10/2020  ? TRIG 164.0 (H) 09/10/2020  ? HDL 40.10 09/10/2020  ? LDLDIRECT 98.0 11/20/2018  ? [place] 77 09/10/2020  ? ALT 23 09/10/2020  ? AST 22 09/10/2020  ? NA 140 09/10/2020  ? K 3.5 09/10/2020  ? CL 105 09/10/2020  ? CREATININE 1.30 09/10/2020  ? BUN 24 (H) 09/10/2020  ? CO2 23 09/10/2020  ? TSH 0.61 09/10/2020  ? PSA 0.71 09/10/2020  ? HGBA1C 6.0 09/10/2020  ? ?Assessment/Plan:  ?Kaplan Venegas is a 41 y.o. Black or African American [2] male with  has a past medical history of Acute gouty arthropathy (08/16/2011), HTN (hypertension) (09/22/2010), Hypertension, Hypertriglyceridemia (12/29/2010), and Impaired glucose tolerance (03/29/2011). ? ?Vitamin D deficiency ?Last vitamin D ?Lab Results  ?Component Value Date  ? VD25OH 45.68 09/10/2020  ? ?Stable, cont oral replacement ? ? ?Encounter for well adult exam with abnormal findings ?Age and sex appropriate education and counseling updated with regular exercise and diet ?Referrals for preventative services - none needed ?Immunizations addressed - for tdap today, declines covid booster, flu shot ?Smoking counseling  - none needed ?Evidence for depression or other mood disorder - none significant ?Most recent labs reviewed. ?I have personally reviewed and have noted: ?1) the patient's medical and social history ?2) The patient's current medications and supplements ?3) The patient's height, weight, and BMI have been recorded in the chart ? ? ?Right hand pain ?Exam c/w tendonitis due to overuse - for mobic prn and rest ? ?Impaired glucose tolerance ?Lab Results  ?  Component Value Date   ? HGBA1C 6.0 09/10/2020  ? ?Stable, pt to continue current medical treatment  - diet ? ? ?HTN (hypertension) ?BP Readings from Last 3 Encounters:  ?07/16/21 (!) 150/98  ?09/10/20 122/80  ?02/19/20 (!) 148/102  ? ?Uncontrolled here, but pt states controlled at home, pt to continue medical treatment hct, losartan, toprol ? ?Followup: Return in about 1 year (around 07/17/2022). ? ?Cathlean Cower, MD 07/18/2021 1:09 PM ?[place] ?[place] ?Internal Medicine ?

## 2021-07-18 ENCOUNTER — Encounter: Payer: Self-pay | Admitting: Internal Medicine

## 2021-07-18 DIAGNOSIS — M79641 Pain in right hand: Secondary | ICD-10-CM | POA: Insufficient documentation

## 2021-07-18 NOTE — Assessment & Plan Note (Signed)
Age and sex appropriate education and counseling updated with regular exercise and diet ?Referrals for preventative services - none needed ?Immunizations addressed - for tdap today, declines covid booster, flu shot ?Smoking counseling  - none needed ?Evidence for depression or other mood disorder - none significant ?Most recent labs reviewed. ?I have personally reviewed and have noted: ?1) the patient's medical and social history ?2) The patient's current medications and supplements ?3) The patient's height, weight, and BMI have been recorded in the chart ? ?

## 2021-07-18 NOTE — Assessment & Plan Note (Signed)
Lab Results  Component Value Date   HGBA1C 6.0 09/10/2020   Stable, pt to continue current medical treatment  - diet  

## 2021-07-18 NOTE — Assessment & Plan Note (Signed)
BP Readings from Last 3 Encounters:  ?07/16/21 (!) 150/98  ?09/10/20 122/80  ?02/19/20 (!) 148/102  ? ?Uncontrolled here, but pt states controlled at home, pt to continue medical treatment hct, losartan, toprol ? ?

## 2021-07-18 NOTE — Assessment & Plan Note (Signed)
Exam c/w tendonitis due to overuse - for mobic prn and rest ?

## 2021-07-20 DIAGNOSIS — G4733 Obstructive sleep apnea (adult) (pediatric): Secondary | ICD-10-CM | POA: Diagnosis not present

## 2021-10-07 ENCOUNTER — Ambulatory Visit (HOSPITAL_COMMUNITY)
Admission: EM | Admit: 2021-10-07 | Discharge: 2021-10-07 | Disposition: A | Discharge status: Home / Self Care | Payer: BC Managed Care – PPO | Attending: Family Medicine | Admitting: Family Medicine

## 2021-10-07 ENCOUNTER — Encounter (HOSPITAL_COMMUNITY): Payer: Self-pay

## 2021-10-07 DIAGNOSIS — R040 Epistaxis: Secondary | ICD-10-CM

## 2021-10-07 DIAGNOSIS — I10 Essential (primary) hypertension: Secondary | ICD-10-CM | POA: Diagnosis not present

## 2021-10-07 MED ORDER — OXYMETAZOLINE HCL 0.05 % NA SOLN
NASAL | Status: AC
  Filled 2021-10-07: qty 30

## 2021-10-07 MED ORDER — OXYMETAZOLINE HCL 0.05 % NA SOLN: 1.0000 | Freq: Two times a day (BID) | NASAL | Status: DC

## 2021-10-07 MED ORDER — OXYMETAZOLINE HCL 0.05 % NA SOLN
1.0000 | Freq: Two times a day (BID) | NASAL | Status: AC
  Administered 2021-10-07: 1 via NASAL

## 2021-10-07 NOTE — Discharge Instructions (Addendum)
Return tomorrow morning and I will remove your nasal packing.  Your blood pressure was noted to be elevated during your visit today. If you are currently taking medication for high blood pressure, please ensure you are taking this as directed. If you do not have a history of high blood pressure and your blood pressure remains persistently elevated, you may need to begin taking a medication at some point. You may return here within the next few days to recheck if unable to see your primary care provider or if you do not have a one.  BP (!) 173/105 (BP Location: Right Arm)   Pulse 88   Temp 98.6 F (37 C) (Oral)   Resp 18   SpO2 99%   BP Readings from Last 3 Encounters:  10/07/21 (!) 173/105  07/16/21 (!) 150/98  09/10/20 122/80

## 2021-10-07 NOTE — ED Triage Notes (Signed)
Pt c/o rt nare bleeding x1 hrs. Denies HAs or hx of nosebleeds.

## 2021-10-08 NOTE — ED Provider Notes (Signed)
[facility]   [id number] 10/07/21 Arrival Time: 1532  ASSESSMENT & PLAN:  1. Epistaxis   2. Elevated blood pressure reading in office with diagnosis of hypertension   3. Right-sided epistaxis    Afrin x 3. Unable to stop bleed after pressure.  Procedure: Using gentle pressure, rhino rocket inserted into RIGHT naris without complication. Observed for 15 minutes. Epistaxis has stopped.  Will return tomorrow morning for re-evaluation.  Meds ordered this encounter  Medications   DISCONTD: oxymetazoline (AFRIN) 0.05 % nasal spray 1 spray   oxymetazoline (AFRIN) 0.05 % nasal spray 1 spray     Discharge Instructions      Return tomorrow morning and I will remove your nasal packing.  Your blood pressure was noted to be elevated during your visit today. If you are currently taking medication for high blood pressure, please ensure you are taking this as directed. If you do not have a history of high blood pressure and your blood pressure remains persistently elevated, you may need to begin taking a medication at some point. You may return here within the next few days to recheck if unable to see your primary care provider or if you do not have a one.  BP (!) 173/105 (BP Location: Right Arm)   Pulse 88   Temp 98.6 F (37 C) (Oral)   Resp 18   SpO2 99%   BP Readings from Last 3 Encounters:  10/07/21 (!) 173/105  07/16/21 (!) 150/98  09/10/20 122/80       Reviewed expectations re: course of current medical issues. Questions answered. Outlined signs and symptoms indicating need for more acute intervention. Patient verbalized understanding. After Visit Summary given.   SUBJECTIVE:  Peter Rivera is a 41 y.o. male who reports abrupt onset of nose bleed; RIGHT; today. Unable to control with pressure. No h/o freq nosebleeds. No trauma. Does wear CPAP at night. Increased blood pressure noted today. Reports that he is treated for HTN. He reports taking medications as  instructed, no chest pain on exertion, no dyspnea on exertion, no swelling of ankles, no orthostatic dizziness or lightheadedness, no orthopnea or paroxysmal nocturnal dyspnea, and no palpitations.  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   HTN (hypertension) 09/22/2010   Hypertension    Hypertriglyceridemia 12/29/2010   Impaired glucose tolerance 03/29/2011   Social History   Socioeconomic History   Marital status: Married    Spouse name: Not on file   Number of children: 1   Years of education: 12   Highest education level: Not on file  Occupational History   Occupation: truck driver  Tobacco Use   Smoking status: Never   Smokeless tobacco: Never  Substance and Sexual Activity   Alcohol use: Yes    Alcohol/week: 0.0 standard drinks of alcohol    Comment: occasional   Drug use: No   Sexual activity: Not on file  Other Topics Concern   Not  on file  Social History Narrative   Not on file   Social Determinants of Health   Financial Resource Strain: Not on file  Food Insecurity: Not on file  Transportation Needs: Not on file  Physical Activity: Not on file  Stress: Not on file  Social Connections: Not on file  Intimate Partner Violence: Not on file   Family History  Problem Relation Age of Onset   Diabetes Paternal Grandmother    Healthy Mother    Healthy Father    Diabetes Maternal Reymundo Poll,  MD 10/08/21 [phone]

## 2021-10-19 DIAGNOSIS — G4733 Obstructive sleep apnea (adult) (pediatric): Secondary | ICD-10-CM | POA: Diagnosis not present

## 2021-10-27 ENCOUNTER — Other Ambulatory Visit (INDEPENDENT_AMBULATORY_CARE_PROVIDER_SITE_OTHER): Payer: BC Managed Care – PPO

## 2021-10-27 DIAGNOSIS — I10 Essential (primary) hypertension: Secondary | ICD-10-CM

## 2021-10-27 DIAGNOSIS — E559 Vitamin D deficiency, unspecified: Secondary | ICD-10-CM

## 2021-10-27 DIAGNOSIS — E538 Deficiency of other specified B group vitamins: Secondary | ICD-10-CM

## 2021-10-27 DIAGNOSIS — R7302 Impaired glucose tolerance (oral): Secondary | ICD-10-CM | POA: Diagnosis not present

## 2021-10-27 DIAGNOSIS — Z0001 Encounter for general adult medical examination with abnormal findings: Secondary | ICD-10-CM

## 2021-10-27 LAB — URINALYSIS, ROUTINE W REFLEX MICROSCOPIC
Bilirubin Urine: NEGATIVE
Hgb urine dipstick: NEGATIVE
Ketones, ur: NEGATIVE
Leukocytes,Ua: NEGATIVE
Nitrite: NEGATIVE
RBC / HPF: NONE SEEN (ref 0–?)
Specific Gravity, Urine: 1.02 (ref 1.000–1.030)
Total Protein, Urine: NEGATIVE
Urine Glucose: NEGATIVE
Urobilinogen, UA: 0.2 (ref 0.0–1.0)
WBC, UA: NONE SEEN (ref 0–?)
pH: 6 (ref 5.0–8.0)

## 2021-10-27 LAB — CBC WITH DIFFERENTIAL/PLATELET
Basophils Absolute: 0.1 10*3/uL (ref 0.0–0.1)
Basophils Relative: 0.8 % (ref 0.0–3.0)
Eosinophils Absolute: 0.3 10*3/uL (ref 0.0–0.7)
Eosinophils Relative: 3.1 % (ref 0.0–5.0)
HCT: 37.6 % — ABNORMAL LOW (ref 39.0–52.0)
Hemoglobin: 12.4 g/dL — ABNORMAL LOW (ref 13.0–17.0)
Lymphocytes Relative: 20.1 % (ref 12.0–46.0)
Lymphs Abs: 1.8 10*3/uL (ref 0.7–4.0)
MCHC: 33 g/dL (ref 30.0–36.0)
MCV: 77.9 fl — ABNORMAL LOW (ref 78.0–100.0)
Monocytes Absolute: 0.6 10*3/uL (ref 0.1–1.0)
Monocytes Relative: 6.8 % (ref 3.0–12.0)
Neutro Abs: 6 10*3/uL (ref 1.4–7.7)
Neutrophils Relative %: 69.2 % (ref 43.0–77.0)
Platelets: 340 10*3/uL (ref 150.0–400.0)
RBC: 4.83 Mil/uL (ref 4.22–5.81)
RDW: 15.6 % — ABNORMAL HIGH (ref 11.5–15.5)
WBC: 8.7 10*3/uL (ref 4.0–10.5)

## 2021-10-27 LAB — TSH: TSH: 0.7 u[IU]/mL (ref 0.35–5.50)

## 2021-10-27 LAB — HEPATIC FUNCTION PANEL
ALT: 16 U/L (ref 0–53)
AST: 18 U/L (ref 0–37)
Albumin: 4 g/dL (ref 3.5–5.2)
Alkaline Phosphatase: 98 U/L (ref 39–117)
Bilirubin, Direct: 0.1 mg/dL (ref 0.0–0.3)
Total Bilirubin: 0.4 mg/dL (ref 0.2–1.2)
Total Protein: 8.2 g/dL (ref 6.0–8.3)

## 2021-10-27 LAB — BASIC METABOLIC PANEL
BUN: 16 mg/dL (ref 6–23)
CO2: 30 mEq/L (ref 19–32)
Calcium: 9.3 mg/dL (ref 8.4–10.5)
Chloride: 101 mEq/L (ref 96–112)
Creatinine, Ser: 1.2 mg/dL (ref 0.40–1.50)
GFR: 75.09 mL/min (ref >60.00)
Glucose, Bld: 91 mg/dL (ref 70–99)
Potassium: 3.3 mEq/L — ABNORMAL LOW (ref 3.5–5.1)
Sodium: 138 mEq/L (ref 135–145)

## 2021-10-27 LAB — LIPID PANEL
Cholesterol: 158 mg/dL (ref 0–200)
HDL: 42.6 mg/dL (ref >39.00)
LDL Cholesterol: 86 mg/dL (ref 0–99)
NonHDL: 115.61
Total CHOL/HDL Ratio: 4
Triglycerides: 148 mg/dL (ref 0.0–149.0)
VLDL: 29.6 mg/dL (ref 0.0–40.0)

## 2021-10-27 LAB — VITAMIN B12: Vitamin B-12: 470 pg/mL (ref 211–911)

## 2021-10-27 LAB — VITAMIN D 25 HYDROXY (VIT D DEFICIENCY, FRACTURES): VITD: 54.66 ng/mL (ref 30.00–100.00)

## 2021-10-27 LAB — HEMOGLOBIN A1C: Hgb A1c MFr Bld: 5.7 % (ref 4.6–6.5)

## 2021-10-27 LAB — PSA: PSA: 1.13 ng/mL (ref 0.10–4.00)

## 2021-11-11 ENCOUNTER — Telehealth: Payer: Self-pay | Admitting: Internal Medicine

## 2021-11-11 MED ORDER — ALLOPURINOL 300 MG PO TABS: 300.0000 mg | ORAL_TABLET | Freq: Every day | ORAL | 2 refills | Status: DC

## 2021-11-11 MED ORDER — LOSARTAN POTASSIUM 100 MG PO TABS: 100.0000 mg | ORAL_TABLET | Freq: Every day | ORAL | 2 refills | Status: DC

## 2021-11-11 MED ORDER — INDOMETHACIN 50 MG PO CAPS: ORAL_CAPSULE | ORAL | 2 refills | Status: DC

## 2021-11-11 NOTE — Telephone Encounter (Signed)
1.Medication Requested: allopurinol (ZYLOPRIM) 300 MG tablet indomethacin (INDOCIN) 50 MG capsule losartan (COZAAR) 100 MG tablet  2. Pharmacy (Name, Street, [facility]): [id number] - [place], [place] - [address] Phone:  [phone]  Fax:  [phone]     3. On Med List: yes  4. Last Visit with PCP: 3.16.2023  5. Next visit date with PCP: 3.18.2024   Agent: Please be advised that RX refills may take up to 3 business days. We ask that you follow-up with your pharmacy.

## 2021-11-11 NOTE — Telephone Encounter (Signed)
Refills sent to pharmacy. 

## 2021-11-18 DIAGNOSIS — G4733 Obstructive sleep apnea (adult) (pediatric): Secondary | ICD-10-CM | POA: Diagnosis not present

## 2021-12-19 DIAGNOSIS — G4733 Obstructive sleep apnea (adult) (pediatric): Secondary | ICD-10-CM | POA: Diagnosis not present

## 2022-01-18 DIAGNOSIS — G4733 Obstructive sleep apnea (adult) (pediatric): Secondary | ICD-10-CM | POA: Diagnosis not present

## 2022-01-19 DIAGNOSIS — G4733 Obstructive sleep apnea (adult) (pediatric): Secondary | ICD-10-CM | POA: Diagnosis not present

## 2022-02-17 DIAGNOSIS — G4733 Obstructive sleep apnea (adult) (pediatric): Secondary | ICD-10-CM | POA: Diagnosis not present

## 2022-03-20 DIAGNOSIS — G4733 Obstructive sleep apnea (adult) (pediatric): Secondary | ICD-10-CM | POA: Diagnosis not present

## 2022-04-13 ENCOUNTER — Other Ambulatory Visit: Payer: Self-pay | Admitting: Internal Medicine

## 2022-04-13 NOTE — Telephone Encounter (Signed)
Please refill as per office routine med refill policy (all routine meds to be refilled for 3 mo or monthly (per pt preference) up to one year from last visit, then month to month grace period for 3 mo, then further med refills will have to be denied) ? ?

## 2022-07-16 ENCOUNTER — Other Ambulatory Visit: Payer: Self-pay | Admitting: Internal Medicine

## 2022-07-19 ENCOUNTER — Ambulatory Visit: Payer: No Typology Code available for payment source | Admitting: Internal Medicine

## 2022-07-27 ENCOUNTER — Ambulatory Visit: Payer: No Typology Code available for payment source | Admitting: Internal Medicine

## 2022-08-03 ENCOUNTER — Ambulatory Visit: Payer: No Typology Code available for payment source | Admitting: Internal Medicine

## 2022-08-04 ENCOUNTER — Other Ambulatory Visit: Payer: Self-pay | Admitting: Internal Medicine

## 2022-08-15 ENCOUNTER — Other Ambulatory Visit: Payer: Self-pay | Admitting: Internal Medicine

## 2022-08-18 ENCOUNTER — Ambulatory Visit: Payer: BC Managed Care – PPO | Admitting: Internal Medicine

## 2022-08-18 VITALS — BP 184/110 | HR 90 | Temp 98.5°F | Ht 64.0 in | Wt 263.4 lb

## 2022-08-18 DIAGNOSIS — E781 Pure hyperglyceridemia: Secondary | ICD-10-CM

## 2022-08-18 DIAGNOSIS — E538 Deficiency of other specified B group vitamins: Secondary | ICD-10-CM | POA: Diagnosis not present

## 2022-08-18 DIAGNOSIS — E559 Vitamin D deficiency, unspecified: Secondary | ICD-10-CM | POA: Diagnosis not present

## 2022-08-18 DIAGNOSIS — Z125 Encounter for screening for malignant neoplasm of prostate: Secondary | ICD-10-CM

## 2022-08-18 DIAGNOSIS — J309 Allergic rhinitis, unspecified: Secondary | ICD-10-CM | POA: Diagnosis not present

## 2022-08-18 DIAGNOSIS — H1033 Unspecified acute conjunctivitis, bilateral: Secondary | ICD-10-CM | POA: Diagnosis not present

## 2022-08-18 DIAGNOSIS — R7302 Impaired glucose tolerance (oral): Secondary | ICD-10-CM

## 2022-08-18 DIAGNOSIS — I10 Essential (primary) hypertension: Secondary | ICD-10-CM

## 2022-08-18 DIAGNOSIS — Z0001 Encounter for general adult medical examination with abnormal findings: Secondary | ICD-10-CM

## 2022-08-18 LAB — HEPATIC FUNCTION PANEL
ALT: 18 U/L (ref 0–53)
AST: 18 U/L (ref 0–37)
Albumin: 4.1 g/dL (ref 3.5–5.2)
Alkaline Phosphatase: 101 U/L (ref 39–117)
Bilirubin, Direct: 0.1 mg/dL (ref 0.0–0.3)
Total Bilirubin: 0.5 mg/dL (ref 0.2–1.2)
Total Protein: 8 g/dL (ref 6.0–8.3)

## 2022-08-18 LAB — BASIC METABOLIC PANEL
BUN: 13 mg/dL (ref 6–23)
CO2: 29 mEq/L (ref 19–32)
Calcium: 9.7 mg/dL (ref 8.4–10.5)
Chloride: 100 mEq/L (ref 96–112)
Creatinine, Ser: 1.07 mg/dL (ref 0.40–1.50)
GFR: 85.68 mL/min (ref >60.00)
Glucose, Bld: 81 mg/dL (ref 70–99)
Potassium: 3.8 mEq/L (ref 3.5–5.1)
Sodium: 139 mEq/L (ref 135–145)

## 2022-08-18 LAB — LIPID PANEL
Cholesterol: 156 mg/dL (ref 0–200)
HDL: 41.8 mg/dL (ref >39.00)
NonHDL: 114.52
Total CHOL/HDL Ratio: 4
Triglycerides: 251 mg/dL — ABNORMAL HIGH (ref 0.0–149.0)
VLDL: 50.2 mg/dL — ABNORMAL HIGH (ref 0.0–40.0)

## 2022-08-18 LAB — CBC WITH DIFFERENTIAL/PLATELET
Basophils Absolute: 0.1 10*3/uL (ref 0.0–0.1)
Basophils Relative: 1 % (ref 0.0–3.0)
Eosinophils Absolute: 0.2 10*3/uL (ref 0.0–0.7)
Eosinophils Relative: 1.8 % (ref 0.0–5.0)
HCT: 43.8 % (ref 39.0–52.0)
Hemoglobin: 14.5 g/dL (ref 13.0–17.0)
Lymphocytes Relative: 18.5 % (ref 12.0–46.0)
Lymphs Abs: 1.8 10*3/uL (ref 0.7–4.0)
MCHC: 33 g/dL (ref 30.0–36.0)
MCV: 77.1 fl — ABNORMAL LOW (ref 78.0–100.0)
Monocytes Absolute: 0.7 10*3/uL (ref 0.1–1.0)
Monocytes Relative: 7.6 % (ref 3.0–12.0)
Neutro Abs: 7 10*3/uL (ref 1.4–7.7)
Neutrophils Relative %: 71.1 % (ref 43.0–77.0)
Platelets: 331 10*3/uL (ref 150.0–400.0)
RBC: 5.69 Mil/uL (ref 4.22–5.81)
RDW: 15.4 % (ref 11.5–15.5)
WBC: 9.9 10*3/uL (ref 4.0–10.5)

## 2022-08-18 LAB — VITAMIN B12: Vitamin B-12: 415 pg/mL (ref 211–911)

## 2022-08-18 LAB — TSH: TSH: 0.68 u[IU]/mL (ref 0.35–5.50)

## 2022-08-18 LAB — VITAMIN D 25 HYDROXY (VIT D DEFICIENCY, FRACTURES): VITD: 33.36 ng/mL (ref 30.00–100.00)

## 2022-08-18 LAB — HEMOGLOBIN A1C: Hgb A1c MFr Bld: 6.1 % (ref 4.6–6.5)

## 2022-08-18 LAB — PSA: PSA: 1.4 ng/mL (ref 0.10–4.00)

## 2022-08-18 MED ORDER — METOPROLOL SUCCINATE ER 100 MG PO TB24: ORAL_TABLET | ORAL | 3 refills | Status: DC

## 2022-08-18 MED ORDER — TOBRAMYCIN 0.3 % OP SOLN: 1.0000 [drp] | Freq: Four times a day (QID) | OPHTHALMIC | 0 refills | Status: AC

## 2022-08-18 MED ORDER — AMLODIPINE BESYLATE 5 MG PO TABS: 5.0000 mg | ORAL_TABLET | Freq: Every day | ORAL | 3 refills | Status: DC

## 2022-08-18 MED ORDER — PREDNISONE 10 MG PO TABS: ORAL_TABLET | ORAL | 0 refills | Status: DC

## 2022-08-18 MED ORDER — AZITHROMYCIN 250 MG PO TABS: ORAL_TABLET | ORAL | 1 refills | Status: AC

## 2022-08-18 NOTE — Progress Notes (Signed)
Patient ID: Peter Rivera, male   DOB: [birth year], 42 y.o.   MRN: [id number]         Chief Complaint:: wellness exam and Annual Exam (And red eye, for a week, in the mornings the eyes are shut due to over saturation of drainage, )  , htn, allergies, hyperglycemia, low vit d       HPI:  Peter Rivera is a 42 y.o. male here for wellness exam; declines covid booster, o/w up to date                        Also Does have several wks ongoing nasal allergy symptoms with clearish congestion, itch and sneezing, with ? Low grade fever, mild pain, ST, cough, mild periorb eye swelling and marked conjunctival redness, but  no wheezing.  Pt denies chest pain, increased sob or doe, wheezing, orthopnea, PND, increased LE swelling, palpitations, dizziness or syncope.   Pt denies polydipsia, polyuria, or new focal neuro s/s.   Has not been taking the amlodipine after running out.       Wt Readings from Last 3 Encounters:  08/18/22 263 lb 6 oz (119.5 kg)  07/16/21 261 lb 3.2 oz (118.5 kg)  09/10/20 259 lb (117.5 kg)   BP Readings from Last 3 Encounters:  08/18/22 (!) 184/110  10/07/21 (!) 173/105  07/16/21 (!) 150/98   Immunization History  Administered Date(s) Administered   Influenza, Seasonal, Injecte, Preservative Fre 04/03/2013, 01/16/2014, 01/08/2015, 01/19/2016, 01/19/2017   Influenza,inj,Quad PF,6+ Mos 04/03/2013, 01/16/2014, 01/08/2015, 01/19/2016, 01/19/2017, 01/30/2018, 02/11/2020   PFIZER(Purple Top)SARS-COV-2 Vaccination 07/29/2019, 08/21/2019   Tdap 09/22/2010, 07/16/2021   Health Maintenance Due  Topic Date Due   COVID-19 Vaccine (3 - 2023-24 season) 01/01/2022      Past Medical History:  Diagnosis Date   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 No pertinent surgical history.  reports that he has never smoked. He has never used smokeless  tobacco. He reports current alcohol use. He reports that he does not use drugs. family history includes Diabetes in his maternal grandfather and paternal grandmother; Healthy in his father and mother. No Known Allergies Current Outpatient Medications on File Prior to Visit  Medication Sig Dispense Refill   allopurinol (ZYLOPRIM) 300 MG tablet TAKE 1 TABLET BY MOUTH DAILY 90 tablet 2   clotrimazole-betamethasone (LOTRISONE) cream Use as directed twice per day as needed to affected area 45 g 1   hydrochlorothiazide (HYDRODIURIL) 25 MG tablet TAKE ONE TABLET BY MOUTH DAILY 90 tablet 3   indomethacin (INDOCIN) 50 MG capsule TAKE ONE CAPSULE BY MOUTH THREE TIMES A DAY AS NEEDED FOR MILD PAIN 90 capsule 2   losartan (COZAAR) 100 MG tablet TAKE 1 TABLET BY MOUTH DAILY 90 tablet 2   meloxicam (MOBIC) 15 MG tablet Take 1 tablet (15 mg total) by mouth daily as needed for pain. 90 tablet 3   No current facility-administered medications on file prior to visit.        ROS:  All others reviewed and negative.  Objective        PE:  BP (!) 184/110   Pulse 90   Temp 98.5 F (36.9 C) (Temporal)   Ht 5\' 4"  (1.626 m)   Wt 263 lb 6 oz (119.5 kg)   SpO2 99%   BMI 45.21 kg/m  Constitutional: Pt appears in NAD               HENT: Head: NCAT.                Right Ear: External ear normal.                 Left Ear: External ear normal. Bilat tm's with mild erythema.  Max sinus areas non tender.  Pharynx with mild erythema, no exudate               Eyes: . Pupils are equal, round, and reactive to light. Conjunctivae with bilateral mild to mod erythema, and EOM are normal, and has mild periorbital upper and lower lid swelling               Nose: without d/c or deformity               Neck: Neck supple. Gross normal ROM               Cardiovascular: Normal rate and regular rhythm.                 Pulmonary/Chest: Effort normal and breath sounds without rales or wheezing.                Abd:   Soft, NT, ND, + BS, no organomegaly               Neurological: Pt is alert. At baseline orientation, motor grossly intact               Skin: Skin is warm. No rashes, no other new lesions, LE edema - none               Psychiatric: Pt behavior is normal without agitation   Micro: none  Cardiac tracings I have personally interpreted today:  none  Pertinent Radiological findings (summarize): none   Lab Results  Component Value Date   WBC 9.9 08/18/2022   HGB 14.5 08/18/2022   HCT 43.8 08/18/2022   PLT 331.0 08/18/2022   GLUCOSE 81 08/18/2022   CHOL 156 08/18/2022   TRIG 251.0 (H) 08/18/2022   HDL 41.80 08/18/2022   LDLDIRECT 95.0 08/18/2022   LDLCALC 86 10/27/2021   ALT 18 08/18/2022   AST 18 08/18/2022   NA 139 08/18/2022   K 3.8 08/18/2022   CL 100 08/18/2022   CREATININE 1.07 08/18/2022   BUN 13 08/18/2022   CO2 29 08/18/2022   TSH 0.68 08/18/2022   PSA 1.40 08/18/2022   HGBA1C 6.1 08/18/2022   Assessment/Plan:  Peter Rivera is a 42 y.o. Black or African American [2] male with  has a past medical history of Acute gouty arthropathy (08/16/2011), HTN (hypertension) (09/22/2010), Hypertension, Hypertriglyceridemia (12/29/2010), and Impaired glucose tolerance (03/29/2011).  Encounter for well adult exam with abnormal findings Age and sex appropriate education and counseling updated with regular exercise and diet Referrals for preventative services - none needed Immunizations addressed - declines covid booster Smoking counseling  - none needed Evidence for depression or other mood disorder - none significant Most recent labs reviewed. I have personally reviewed and have noted: 1) the patient's medical and social history 2) The patient's current medications and supplements 3) The patient's height, weight, and BMI have been recorded in the chart   Allergic rhinitis Mild to mod seasonal flare, for prednisone taper,  to f/u any worsening symptoms or concerns   HTN  (hypertension) BP Readings from Last 3  Encounters:  08/18/22 (!) 184/110  10/07/21 (!) 173/105  07/16/21 (!) 150/98   Severe uncontrolled, at least in part possibly reactive, has not been taking tx recently, encouraged compliance, pt to restart medical treatment amlodipine 5 mg qd, hct 25 mg qd, losartan 100 mg qd, toprol xl 100 qd and f/u bp at home and next visit   Hypertriglyceridemia Lab Results  Component Value Date   CHOL 156 08/18/2022   HDL 41.80 08/18/2022   LDLCALC 86 10/27/2021   LDLDIRECT 95.0 08/18/2022   TRIG 251.0 (H) 08/18/2022   CHOLHDL 4 08/18/2022  Mild, for lower fat lower chol diet wt control  Impaired glucose tolerance Lab Results  Component Value Date   HGBA1C 6.1 08/18/2022   Stable, pt to continue current medical treatment  - diet, wt control   Vitamin D deficiency Last vitamin D Lab Results  Component Value Date   VD25OH 33.36 08/18/2022   Low, to start oral replacement   Conjunctivitis With evidence for URI as well, can't r/o bacterial, for zpack, and topical tobramycin opth asd  Followup: Return in about 6 months (around 02/17/2023).  Oliver Barre, MD 08/21/2022 5:32 AM [facility] - [facility]

## 2022-08-18 NOTE — Patient Instructions (Addendum)
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  Please continue your efforts at being more active, low cholesterol diet, and weight control.  You are otherwise up to date with prevention measures today.  Please keep your appointments with your specialists as you may have planned  Please go to the LAB at the blood drawing area for the tests to be done  You will be contacted by phone if any changes need to be made immediately.  Otherwise, you will receive a letter about your results with an explanation, but please check with MyChart first.  Please remember to sign up for MyChart if you have not done so, as this will be important to you in the future with finding out test results, communicating by private email, and scheduling acute appointments online when needed.  Please make an Appointment to return in 6 months, or sooner if needed

## 2022-08-19 LAB — URINALYSIS, ROUTINE W REFLEX MICROSCOPIC
Bilirubin Urine: NEGATIVE
Hgb urine dipstick: NEGATIVE
Ketones, ur: NEGATIVE
Leukocytes,Ua: NEGATIVE
Nitrite: NEGATIVE
RBC / HPF: NONE SEEN (ref 0–?)
Specific Gravity, Urine: 1.02 (ref 1.000–1.030)
Total Protein, Urine: NEGATIVE
Urine Glucose: NEGATIVE
Urobilinogen, UA: 0.2 (ref 0.0–1.0)
pH: 6.5 (ref 5.0–8.0)

## 2022-08-19 LAB — LDL CHOLESTEROL, DIRECT: Direct LDL: 95 mg/dL

## 2022-08-19 NOTE — Progress Notes (Signed)
The test results show that your current treatment is OK, as the tests are stable.  Please continue the same plan.  There is no other need for change of treatment or further evaluation based on these results, at this time.  thanks 

## 2022-08-21 ENCOUNTER — Encounter: Payer: Self-pay | Admitting: Internal Medicine

## 2022-08-21 DIAGNOSIS — H109 Unspecified conjunctivitis: Secondary | ICD-10-CM | POA: Insufficient documentation

## 2022-08-21 NOTE — Assessment & Plan Note (Signed)
BP Readings from Last 3 Encounters:  08/18/22 (!) 184/110  10/07/21 (!) 173/105  07/16/21 (!) 150/98   Severe uncontrolled, at least in part possibly reactive, has not been taking tx recently, encouraged compliance, pt to restart medical treatment amlodipine 5 mg qd, hct 25 mg qd, losartan 100 mg qd, toprol xl 100 qd and f/u bp at home and next visit

## 2022-08-21 NOTE — Assessment & Plan Note (Signed)
Lab Results  Component Value Date   HGBA1C 6.1 08/18/2022   Stable, pt to continue current medical treatment  - diet, wt control

## 2022-08-21 NOTE — Assessment & Plan Note (Signed)
Last vitamin D Lab Results  Component Value Date   VD25OH 33.36 08/18/2022   Low, to start oral replacement

## 2022-08-21 NOTE — Assessment & Plan Note (Signed)
With evidence for URI as well, can't r/o bacterial, for zpack, and topical tobramycin opth asd

## 2022-08-21 NOTE — Assessment & Plan Note (Signed)
Mild to mod seasonal flare, for prednisone taper,  to f/u any worsening symptoms or concerns ?

## 2022-08-21 NOTE — Assessment & Plan Note (Signed)
Lab Results  Component Value Date   CHOL 156 08/18/2022   HDL 41.80 08/18/2022   LDLCALC 86 10/27/2021   LDLDIRECT 95.0 08/18/2022   TRIG 251.0 (H) 08/18/2022   CHOLHDL 4 08/18/2022  Mild, for lower fat lower chol diet wt control

## 2022-08-21 NOTE — Assessment & Plan Note (Signed)

## 2022-11-11 DIAGNOSIS — G4733 Obstructive sleep apnea (adult) (pediatric): Secondary | ICD-10-CM | POA: Diagnosis not present

## 2022-12-28 DIAGNOSIS — M25561 Pain in right knee: Secondary | ICD-10-CM | POA: Diagnosis not present

## 2023-01-06 DIAGNOSIS — M1711 Unilateral primary osteoarthritis, right knee: Secondary | ICD-10-CM | POA: Diagnosis not present

## 2023-01-26 ENCOUNTER — Other Ambulatory Visit: Payer: Self-pay | Admitting: Internal Medicine

## 2023-01-26 ENCOUNTER — Other Ambulatory Visit: Payer: Self-pay

## 2023-01-26 DIAGNOSIS — S83241A Other tear of medial meniscus, current injury, right knee, initial encounter: Secondary | ICD-10-CM | POA: Diagnosis not present

## 2023-02-08 DIAGNOSIS — M25561 Pain in right knee: Secondary | ICD-10-CM | POA: Diagnosis not present

## 2023-02-17 ENCOUNTER — Ambulatory Visit: Payer: Self-pay | Admitting: Internal Medicine

## 2023-02-24 DIAGNOSIS — M25561 Pain in right knee: Secondary | ICD-10-CM | POA: Diagnosis not present

## 2023-04-14 ENCOUNTER — Other Ambulatory Visit: Payer: Self-pay

## 2023-04-14 ENCOUNTER — Other Ambulatory Visit: Payer: Self-pay | Admitting: Internal Medicine

## 2023-05-04 ENCOUNTER — Other Ambulatory Visit: Payer: Self-pay | Admitting: Internal Medicine

## 2023-05-05 ENCOUNTER — Other Ambulatory Visit: Payer: Self-pay

## 2023-07-14 DIAGNOSIS — M25562 Pain in left knee: Secondary | ICD-10-CM | POA: Diagnosis not present

## 2023-08-09 ENCOUNTER — Other Ambulatory Visit: Payer: Self-pay

## 2023-08-09 ENCOUNTER — Other Ambulatory Visit: Payer: Self-pay | Admitting: Internal Medicine

## 2023-08-26 DIAGNOSIS — G4733 Obstructive sleep apnea (adult) (pediatric): Secondary | ICD-10-CM | POA: Diagnosis not present

## 2023-09-07 ENCOUNTER — Other Ambulatory Visit: Payer: Self-pay

## 2023-09-07 ENCOUNTER — Other Ambulatory Visit: Payer: Self-pay | Admitting: Internal Medicine

## 2023-09-25 DIAGNOSIS — G4733 Obstructive sleep apnea (adult) (pediatric): Secondary | ICD-10-CM | POA: Diagnosis not present

## 2023-10-26 DIAGNOSIS — G4733 Obstructive sleep apnea (adult) (pediatric): Secondary | ICD-10-CM | POA: Diagnosis not present

## 2023-12-19 ENCOUNTER — Other Ambulatory Visit: Payer: Self-pay | Admitting: Internal Medicine

## 2023-12-19 ENCOUNTER — Encounter: Payer: Self-pay | Admitting: Internal Medicine

## 2023-12-19 ENCOUNTER — Ambulatory Visit: Payer: Self-pay | Admitting: Internal Medicine

## 2023-12-19 ENCOUNTER — Other Ambulatory Visit: Payer: Self-pay

## 2023-12-19 ENCOUNTER — Ambulatory Visit (INDEPENDENT_AMBULATORY_CARE_PROVIDER_SITE_OTHER): Admitting: Internal Medicine

## 2023-12-19 VITALS — BP 128/86 | HR 80 | Temp 97.8°F | Ht 64.0 in | Wt 260.0 lb

## 2023-12-19 DIAGNOSIS — F901 Attention-deficit hyperactivity disorder, predominantly hyperactive type: Secondary | ICD-10-CM | POA: Diagnosis not present

## 2023-12-19 DIAGNOSIS — M1A9XX0 Chronic gout, unspecified, without tophus (tophi): Secondary | ICD-10-CM | POA: Diagnosis not present

## 2023-12-19 DIAGNOSIS — Z0001 Encounter for general adult medical examination with abnormal findings: Secondary | ICD-10-CM

## 2023-12-19 DIAGNOSIS — R7302 Impaired glucose tolerance (oral): Secondary | ICD-10-CM

## 2023-12-19 DIAGNOSIS — E781 Pure hyperglyceridemia: Secondary | ICD-10-CM | POA: Diagnosis not present

## 2023-12-19 DIAGNOSIS — I10 Essential (primary) hypertension: Secondary | ICD-10-CM | POA: Diagnosis not present

## 2023-12-19 DIAGNOSIS — R972 Elevated prostate specific antigen [PSA]: Secondary | ICD-10-CM | POA: Insufficient documentation

## 2023-12-19 DIAGNOSIS — L259 Unspecified contact dermatitis, unspecified cause: Secondary | ICD-10-CM | POA: Diagnosis not present

## 2023-12-19 DIAGNOSIS — Z125 Encounter for screening for malignant neoplasm of prostate: Secondary | ICD-10-CM

## 2023-12-19 DIAGNOSIS — E538 Deficiency of other specified B group vitamins: Secondary | ICD-10-CM

## 2023-12-19 DIAGNOSIS — F913 Oppositional defiant disorder: Secondary | ICD-10-CM | POA: Diagnosis not present

## 2023-12-19 DIAGNOSIS — E559 Vitamin D deficiency, unspecified: Secondary | ICD-10-CM

## 2023-12-19 LAB — HEPATIC FUNCTION PANEL
ALT: 27 U/L (ref 0–53)
AST: 23 U/L (ref 0–37)
Albumin: 3.8 g/dL (ref 3.5–5.2)
Alkaline Phosphatase: 97 U/L (ref 39–117)
Bilirubin, Direct: 0.1 mg/dL (ref 0.0–0.3)
Total Bilirubin: 0.4 mg/dL (ref 0.2–1.2)
Total Protein: 7.6 g/dL (ref 6.0–8.3)

## 2023-12-19 LAB — CBC WITH DIFFERENTIAL/PLATELET
Basophils Absolute: 0.1 K/uL (ref 0.0–0.1)
Basophils Relative: 0.9 % (ref 0.0–3.0)
Eosinophils Absolute: 0.3 K/uL (ref 0.0–0.7)
Eosinophils Relative: 4.1 % (ref 0.0–5.0)
HCT: 40.4 % (ref 39.0–52.0)
Hemoglobin: 13.4 g/dL (ref 13.0–17.0)
Lymphocytes Relative: 17.5 % (ref 12.0–46.0)
Lymphs Abs: 1.4 K/uL (ref 0.7–4.0)
MCHC: 33.2 g/dL (ref 30.0–36.0)
MCV: 79.7 fl (ref 78.0–100.0)
Monocytes Absolute: 0.6 K/uL (ref 0.1–1.0)
Monocytes Relative: 7.6 % (ref 3.0–12.0)
Neutro Abs: 5.4 K/uL (ref 1.4–7.7)
Neutrophils Relative %: 69.9 % (ref 43.0–77.0)
Platelets: 332 K/uL (ref 150.0–400.0)
RBC: 5.07 Mil/uL (ref 4.22–5.81)
RDW: 15 % (ref 11.5–15.5)
WBC: 7.7 K/uL (ref 4.0–10.5)

## 2023-12-19 LAB — LIPID PANEL
Cholesterol: 165 mg/dL (ref 0–200)
HDL: 45.4 mg/dL (ref >39.00)
LDL Cholesterol: 89 mg/dL (ref 0–99)
NonHDL: 120.01
Total CHOL/HDL Ratio: 4
Triglycerides: 155 mg/dL — ABNORMAL HIGH (ref 0.0–149.0)
VLDL: 31 mg/dL (ref 0.0–40.0)

## 2023-12-19 LAB — URINALYSIS, ROUTINE W REFLEX MICROSCOPIC
Bilirubin Urine: NEGATIVE
Hgb urine dipstick: NEGATIVE
Ketones, ur: NEGATIVE
Leukocytes,Ua: NEGATIVE
Nitrite: NEGATIVE
RBC / HPF: NONE SEEN (ref 0–?)
Specific Gravity, Urine: 1.02 (ref 1.000–1.030)
Urine Glucose: NEGATIVE
Urobilinogen, UA: 0.2 (ref 0.0–1.0)
pH: 6 (ref 5.0–8.0)

## 2023-12-19 LAB — BASIC METABOLIC PANEL WITH GFR
BUN: 22 mg/dL (ref 6–23)
CO2: 28 meq/L (ref 19–32)
Calcium: 9.2 mg/dL (ref 8.4–10.5)
Chloride: 102 meq/L (ref 96–112)
Creatinine, Ser: 1.11 mg/dL (ref 0.40–1.50)
GFR: 81.23 mL/min (ref >60.00)
Glucose, Bld: 89 mg/dL (ref 70–99)
Potassium: 3.7 meq/L (ref 3.5–5.1)
Sodium: 139 meq/L (ref 135–145)

## 2023-12-19 LAB — HEMOGLOBIN A1C: Hgb A1c MFr Bld: 6.1 % (ref 4.6–6.5)

## 2023-12-19 LAB — PSA: PSA: 1.29 ng/mL (ref 0.10–4.00)

## 2023-12-19 LAB — URIC ACID: Uric Acid, Serum: 7.2 mg/dL (ref 4.0–7.8)

## 2023-12-19 LAB — VITAMIN B12: Vitamin B-12: 370 pg/mL (ref 211–911)

## 2023-12-19 LAB — VITAMIN D 25 HYDROXY (VIT D DEFICIENCY, FRACTURES): VITD: 39.4 ng/mL (ref 30.00–100.00)

## 2023-12-19 LAB — TSH: TSH: 0.44 u[IU]/mL (ref 0.35–5.50)

## 2023-12-19 MED ORDER — PREDNISONE 10 MG PO TABS: ORAL_TABLET | ORAL | 0 refills | Status: AC

## 2023-12-19 MED ORDER — METFORMIN HCL ER 500 MG PO TB24
500.0000 mg | ORAL_TABLET | Freq: Every day | ORAL | 3 refills | Status: AC
Start: 2023-12-19 — End: ?
  Filled 2023-12-19: qty 30, 30d supply, fill #0

## 2023-12-19 MED ORDER — TRIAMCINOLONE ACETONIDE 0.1 % EX CREA: 1.0000 | TOPICAL_CREAM | Freq: Two times a day (BID) | CUTANEOUS | 1 refills | Status: AC

## 2023-12-19 NOTE — Assessment & Plan Note (Signed)
Also for uric acid with labs 

## 2023-12-19 NOTE — Patient Instructions (Signed)
 Please take all new medication as prescribed - the prednisone , and the cream as needed  There is also OTC Benadryl cream that might help as well as needed  Please continue all other medications as before, and refills have been done if requested.  Please have the pharmacy call with any other refills you may need.  Please continue your efforts at being more active, low cholesterol diet, and weight control.  You are otherwise up to date with prevention measures today.  Please keep your appointments with your specialists as you may have planned  Please go to the LAB at the blood drawing area for the tests to be done  You will be contacted by phone if any changes need to be made immediately.  Otherwise, you will receive a letter about your results with an explanation, but please check with MyChart first.  Please make an Appointment to return for your 1 year visit, or sooner if needed

## 2023-12-19 NOTE — Assessment & Plan Note (Signed)
 BP Readings from Last 3 Encounters:  12/19/23 128/86  08/18/22 (!) 184/110  10/07/21 (!) 173/105   Stable, pt to continue medical treatment norvasc  5 every day, hct 25 every day, toprol  xl  100 every day, losartan  100 qd

## 2023-12-19 NOTE — Assessment & Plan Note (Addendum)
 Lab Results  Component Value Date   PSA 1.29 12/19/2023   PSA 1.40 08/18/2022   PSA 1.13 10/27/2021   Stable, cont current med tx

## 2023-12-19 NOTE — Assessment & Plan Note (Signed)
 Last vitamin D  Lab Results  Component Value Date   VD25OH 39.40 12/19/2023   Low, to start oral replacement

## 2023-12-19 NOTE — Progress Notes (Signed)
 Patient ID: Peter Rivera, male   DOB: 1981-02-12, 43 y.o.   MRN: [id number]         Chief Complaint:: wellness exam and low vit d, hyperglycemia, hld, htn, gout       HPI:  Peter Rivera is a 43 y.o. male here for wellness exam; decliens all immunizations, o/'w up to date                        Also has rash and itching to distal arms and legs after several hours mowing the grass over the weekend.  Denies urinary symptoms such as dysuria, frequency, urgency, flank pain, hematuria or n/v, fever, chills.  Pt denies chest pain, increased sob or doe, wheezing, orthopnea, PND, increased LE swelling, palpitations, dizziness or syncope.   Pt denies polydipsia, polyuria, or new focal neuro s/s.       Wt Readings from Last 3 Encounters:  12/19/23 260 lb (117.9 kg)  08/18/22 263 lb 6 oz (119.5 kg)  07/16/21 261 lb 3.2 oz (118.5 kg)   BP Readings from Last 3 Encounters:  12/19/23 128/86  08/18/22 (!) 184/110  10/07/21 (!) 173/105   Immunization History  Administered Date(s) Administered   Influenza, Seasonal, Injecte, Preservative Fre 04/03/2013, 01/16/2014, 01/08/2015, 01/19/2016, 01/19/2017   Influenza,inj,Quad PF,6+ Mos 04/03/2013, 01/16/2014, 01/08/2015, 01/19/2016, 01/19/2017, 01/30/2018, 02/11/2020   PFIZER(Purple Top)SARS-COV-2 Vaccination 07/29/2019, 08/21/2019   Tdap 09/22/2010, 07/16/2021  There are no preventive care reminders to display for this patient.    Past Medical History:  Diagnosis Date   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 No pertinent surgical history.  reports that he has never smoked. He has never used smokeless tobacco. He reports current alcohol use. He reports that he does not use drugs. family history includes Diabetes in his maternal grandfather and paternal grandmother; Healthy in his father and mother. No Known  Allergies Current Outpatient Medications on File Prior to Visit  Medication Sig Dispense Refill   allopurinol  (ZYLOPRIM ) 300 MG tablet TAKE 1 TABLET BY MOUTH DAILY 90 tablet 3   amLODipine  (NORVASC ) 5 MG tablet TAKE 1 TABLET BY MOUTH DAILY 90 tablet 3   clotrimazole -betamethasone  (LOTRISONE ) cream Use as directed twice per day as needed to affected area 45 g 1   hydrochlorothiazide  (HYDRODIURIL ) 25 MG tablet TAKE 1 TABLET BY MOUTH DAILY 90 tablet 3   indomethacin  (INDOCIN ) 50 MG capsule TAKE ONE CAPSULE BY MOUTH THREE TIMES A DAY AS NEEDED FOR MILD PAIN 90 capsule 2   losartan  (COZAAR ) 100 MG tablet TAKE 1 TABLET BY MOUTH DAILY 90 tablet 3   meloxicam  (MOBIC ) 15 MG tablet Take 1 tablet (15 mg total) by mouth daily as needed for pain. 90 tablet 3   metoprolol  succinate (TOPROL -XL) 100 MG 24 hr tablet TAKE 1 TABLET BY MOUTH DAILY WITH OR IMMEDIATELY FOLLOWING A MEAL 90 tablet 3   No current facility-administered medications on file prior to visit.        ROS:  All others reviewed and negative.  Objective        PE:  BP 128/86   Pulse 80   Temp 97.8 F (36.6 C) (Temporal)   Ht 5' 4 (1.626 m)   Wt 260 lb (117.9 kg)   SpO2 98%   BMI 44.63 kg/m  Constitutional: Pt appears in NAD               HENT: Head: NCAT.                Right Ear: External ear normal.                 Left Ear: External ear normal.                Eyes: . Pupils are equal, round, and reactive to light. Conjunctivae and EOM are normal               Nose: without d/c or deformity               Neck: Neck supple. Gross normal ROM               Cardiovascular: Normal rate and regular rhythm.                 Pulmonary/Chest: Effort normal and breath sounds without rales or wheezing.                Abd:  Soft, NT, ND, + BS, no organomegaly               Neurological: Pt is alert. At baseline orientation, motor grossly intact               Skin: Skin is warm. Diffuse erythem rash to distal arms and leg  below knees, rashes, no other new lesions, LE edema - none               Psychiatric: Pt behavior is normal without agitation   Micro: none  Cardiac tracings I have personally interpreted today:  none  Pertinent Radiological findings (summarize): none   Lab Results  Component Value Date   WBC 7.7 12/19/2023   HGB 13.4 12/19/2023   HCT 40.4 12/19/2023   PLT 332.0 12/19/2023   GLUCOSE 89 12/19/2023   CHOL 165 12/19/2023   TRIG 155.0 (H) 12/19/2023   HDL 45.40 12/19/2023   LDLDIRECT 95.0 08/18/2022   LDLCALC 89 12/19/2023   ALT 27 12/19/2023   AST 23 12/19/2023   NA 139 12/19/2023   K 3.7 12/19/2023   CL 102 12/19/2023   CREATININE 1.11 12/19/2023   BUN 22 12/19/2023   CO2 28 12/19/2023   TSH 0.44 12/19/2023   PSA 1.29 12/19/2023   HGBA1C 6.1 12/19/2023   Assessment/Plan:  Peter Rivera is a 43 y.o. Black or African American [2] male with  has a past medical history of Acute gouty arthropathy (08/16/2011), HTN (hypertension) (09/22/2010), Hypertension, Hypertriglyceridemia (12/29/2010), and Impaired glucose tolerance (03/29/2011).  Encounter for well adult exam with abnormal findings Age and sex appropriate education and counseling updated with regular exercise and diet Referrals for preventative services - none needed Immunizations addressed - declines all immunizations Smoking counseling  - none needed Evidence for depression or other mood disorder - none significant Most recent labs reviewed. I have personally reviewed and have noted: 1) the patient's medical and social history 2) The patient's current medications and supplements 3) The patient's height, weight, and BMI have been recorded in the chart   Vitamin D  deficiency Last vitamin D  Lab Results  Component Value Date   VD25OH 39.40 12/19/2023   Low, to start oral replacement   Impaired glucose tolerance Lab Results  Component Value Date   HGBA1C 6.1 12/19/2023   Stable, pt to continue current medical  treatment metformin   ER 500 mg qd   Hypertriglyceridemia Lab Results  Component Value Date   LDLCALC 89 12/19/2023   Stable, pt to continue with low fat, low chol diet   HTN (hypertension) BP Readings from Last 3 Encounters:  12/19/23 128/86  08/18/22 (!) 184/110  10/07/21 (!) 173/105   Stable, pt to continue medical treatment norvasc  5 every day, hct 25 every day, toprol  xl  100 every day, losartan  100 qd   Gout Also for uric acid with labs  Contact dermatitis Mild to mod, for prednisone  taper and triam cr prn asd  to f/u any worsening symptoms or concerns  Increased prostate specific antigen (PSA) velocity Lab Results  Component Value Date   PSA 1.29 12/19/2023   PSA 1.40 08/18/2022   PSA 1.13 10/27/2021   Stable, cont current med tx  Followup: Return in about 1 year (around 12/18/2024).  Peter Rush, MD 12/19/2023 9:27 PM [facility]

## 2023-12-19 NOTE — Assessment & Plan Note (Signed)
 Mild to mod, for prednisone  taper and triam cr prn asd  to f/u any worsening symptoms or concerns

## 2023-12-19 NOTE — Assessment & Plan Note (Signed)
 Age and sex appropriate education and counseling updated with regular exercise and diet Referrals for preventative services - none needed Immunizations addressed - declines all immunizations Smoking counseling  - none needed Evidence for depression or other mood disorder - none significant Most recent labs reviewed. I have personally reviewed and have noted: 1) the patient's medical and social history 2) The patient's current medications and supplements 3) The patient's height, weight, and BMI have been recorded in the chart

## 2023-12-19 NOTE — Assessment & Plan Note (Signed)
 Lab Results  Component Value Date   LDLCALC 89 12/19/2023   Stable, pt to continue with low fat, low chol diet

## 2023-12-19 NOTE — Assessment & Plan Note (Signed)
 Lab Results  Component Value Date   HGBA1C 6.1 12/19/2023   Stable, pt to continue current medical treatment metformin  ER 500 mg qd

## 2023-12-30 ENCOUNTER — Other Ambulatory Visit: Payer: Self-pay

## 2024-01-23 ENCOUNTER — Other Ambulatory Visit: Payer: Self-pay | Admitting: Internal Medicine

## 2024-04-18 ENCOUNTER — Other Ambulatory Visit: Payer: Self-pay

## 2024-04-18 ENCOUNTER — Other Ambulatory Visit: Payer: Self-pay | Admitting: Internal Medicine

## 2024-04-28 ENCOUNTER — Other Ambulatory Visit: Payer: Self-pay | Admitting: Internal Medicine

## 2024-04-30 ENCOUNTER — Other Ambulatory Visit: Payer: Self-pay

## 2024-04-30 ENCOUNTER — Telehealth: Payer: Self-pay

## 2024-04-30 NOTE — Telephone Encounter (Signed)
 Copied from CRM #[id number]. Topic: Referral - Request for Referral >> Apr 30, 2024  3:00 PM Charlet HERO wrote: Did the patient discuss referral with their provider in the last year? Yes  Appointment offered? No  Type of order/referral and detailed reason for visit: Cpap machine  Preference of office, provider, location: N/A  If referral order, have you been seen by this specialty before? Yes N/A  Can we respond through MyChart? Yes

## 2024-05-01 NOTE — Telephone Encounter (Signed)
 Sorry , no I dont normally do the evaluation or treatment for sleep apnea  I can refer to pulmonary for this, but he might otherwise call the original provider

## 2024-05-12 ENCOUNTER — Other Ambulatory Visit: Payer: Self-pay | Admitting: Internal Medicine

## 2024-05-14 ENCOUNTER — Other Ambulatory Visit: Payer: Self-pay
